# Patient Record
Sex: Male | Born: 1940 | Race: White | Hispanic: No | Marital: Married | Smoking: Current every day smoker
Health system: Southern US, Community
[De-identification: ages and names within clinical notes are randomized; demographics above are authoritative.]

## PROBLEM LIST (undated history)

## (undated) DIAGNOSIS — I10 Essential (primary) hypertension: Secondary | ICD-10-CM

## (undated) DIAGNOSIS — J45909 Unspecified asthma, uncomplicated: Secondary | ICD-10-CM

## (undated) DIAGNOSIS — J449 Chronic obstructive pulmonary disease, unspecified: Secondary | ICD-10-CM

## (undated) DIAGNOSIS — C801 Malignant (primary) neoplasm, unspecified: Secondary | ICD-10-CM

## (undated) DIAGNOSIS — C189 Malignant neoplasm of colon, unspecified: Secondary | ICD-10-CM

## (undated) DIAGNOSIS — M1990 Unspecified osteoarthritis, unspecified site: Secondary | ICD-10-CM

## (undated) HISTORY — PX: ABDOMINAL SURGERY: SHX537

---

## 2006-12-25 ENCOUNTER — Ambulatory Visit: Admission: RE | Admit: 2006-12-25 | Discharge: 2007-01-02 | Payer: Self-pay | Admitting: Radiation Oncology

## 2007-02-26 ENCOUNTER — Ambulatory Visit: Admission: RE | Admit: 2007-02-26 | Discharge: 2007-04-29 | Payer: Self-pay | Admitting: Radiation Oncology

## 2007-03-27 ENCOUNTER — Encounter: Admission: RE | Admit: 2007-03-27 | Discharge: 2007-03-27 | Payer: Self-pay | Admitting: Urology

## 2007-03-28 ENCOUNTER — Ambulatory Visit (HOSPITAL_BASED_OUTPATIENT_CLINIC_OR_DEPARTMENT_OTHER): Admission: RE | Admit: 2007-03-28 | Discharge: 2007-03-28 | Payer: Self-pay | Admitting: Urology

## 2010-10-12 NOTE — Op Note (Signed)
NAME:  Sean Salas, Sean Salas NO.:  [id number]   MEDICAL RECORD NO.:  [id number]          PATIENT TYPE:  REC   LOCATION:  [facility]NC                         FACILITY:  [facility]   PHYSICIAN:  Courtney Paris, M.D.DATE OF BIRTH:  Oct 24, 1940   DATE OF PROCEDURE:  03/28/2007  DATE OF DISCHARGE:                               OPERATIVE REPORT   PREOPERATIVE DIAGNOSIS:  T2B Gleason 4+3 adenocarcinoma of prostate.   POSTOPERATIVE DIAGNOSIS:  T2B Gleason 4+3 adenocarcinoma of prostate.   OPERATION:  Brachytherapy of the prostate plus cysto.   ANESTHESIA:  General.   SURGEON:  Courtney Paris, M.D. and Maryln Gottron, M.D.   BRIEF HISTORY:  The patient is a 70 year old patient who was found to  have clinical T2B Gleason 4+3 adenocarcinoma of the prostate who enters  now for seed implant.  He had hormone deprivation therapy since June  2008 and has shrunk his prostate from a 60 to 27 grams.  PSA had risen  from 2.4 in 2006 to 4.9 in April 2008.  Most of his cancer was at the  right base.  He does enter now for definitive therapy.   The patient was placed in the operating table in dorsal lithotomy  position.  After satisfactory induction of general anesthesia he was  prepped and draped with Betadine in the usual sterile fashion.  Rectal  tube and a Foley catheter were inserted and the planning was done with  ultrasounds by Dr. Dayton Scrape.  When this was done, the implant was started  and a total of 23 needles were used, 58 seeds x 125.  The total apparent  activity was 24.94 mCi.  The catheter was then removed and cystoscope  was then passed after reprepping the patient. Anterior urethra was  normal.  Prostatic urethra was slightly enlarged but no seeds were seen  and the bladder was entered.  There were no seeds within the bladder  even on retrograde view of the bladder neck.  The scope was then removed  and a #16 Foley catheter reinserted and left to straight drainage.  The  patient taken to recovery room in good condition to be later discharged  as an outpatient with detailed written instructions.  He will come back  in two weeks for follow-up.      Courtney Paris, M.D.  Electronically Signed     HMK/MEDQ  D:  03/28/2007  T:  03/28/2007  Job:  [id number]

## 2011-03-09 LAB — COMPREHENSIVE METABOLIC PANEL
ALT: 39
AST: 27
Albumin: 3.9
Creatinine, Ser: 0.79
GFR calc Af Amer: >60
GFR calc non Af Amer: >60
Potassium: 4.3

## 2011-03-09 LAB — CBC
HCT: 37.9 — ABNORMAL LOW
Hemoglobin: 13
MCHC: 34.4
MCV: 90.6
Platelets: 282
RBC: 4.18 — ABNORMAL LOW
RDW: 13.4
WBC: 9.1

## 2011-03-09 LAB — PROTIME-INR: Prothrombin Time: 12.9

## 2014-11-26 DIAGNOSIS — Z85038 Personal history of other malignant neoplasm of large intestine: Secondary | ICD-10-CM | POA: Diagnosis not present

## 2014-11-26 DIAGNOSIS — R351 Nocturia: Secondary | ICD-10-CM | POA: Diagnosis not present

## 2014-11-26 DIAGNOSIS — C61 Malignant neoplasm of prostate: Secondary | ICD-10-CM | POA: Diagnosis not present

## 2014-11-27 DIAGNOSIS — I10 Essential (primary) hypertension: Secondary | ICD-10-CM | POA: Diagnosis not present

## 2014-11-27 DIAGNOSIS — E782 Mixed hyperlipidemia: Secondary | ICD-10-CM | POA: Diagnosis not present

## 2014-11-27 DIAGNOSIS — Z8546 Personal history of malignant neoplasm of prostate: Secondary | ICD-10-CM | POA: Diagnosis not present

## 2014-11-27 DIAGNOSIS — Z85038 Personal history of other malignant neoplasm of large intestine: Secondary | ICD-10-CM | POA: Diagnosis not present

## 2014-11-27 DIAGNOSIS — C801 Malignant (primary) neoplasm, unspecified: Secondary | ICD-10-CM | POA: Diagnosis not present

## 2014-11-27 DIAGNOSIS — G629 Polyneuropathy, unspecified: Secondary | ICD-10-CM | POA: Diagnosis not present

## 2014-11-27 DIAGNOSIS — F17210 Nicotine dependence, cigarettes, uncomplicated: Secondary | ICD-10-CM | POA: Diagnosis not present

## 2014-11-27 DIAGNOSIS — F17200 Nicotine dependence, unspecified, uncomplicated: Secondary | ICD-10-CM | POA: Diagnosis not present

## 2014-11-27 DIAGNOSIS — R7309 Other abnormal glucose: Secondary | ICD-10-CM | POA: Diagnosis not present

## 2014-12-09 DIAGNOSIS — F17200 Nicotine dependence, unspecified, uncomplicated: Secondary | ICD-10-CM | POA: Diagnosis not present

## 2014-12-09 DIAGNOSIS — Z139 Encounter for screening, unspecified: Secondary | ICD-10-CM | POA: Diagnosis not present

## 2014-12-09 DIAGNOSIS — Z0000 Encounter for general adult medical examination without abnormal findings: Secondary | ICD-10-CM | POA: Diagnosis not present

## 2014-12-09 DIAGNOSIS — Z1389 Encounter for screening for other disorder: Secondary | ICD-10-CM | POA: Diagnosis not present

## 2015-03-24 DIAGNOSIS — I10 Essential (primary) hypertension: Secondary | ICD-10-CM | POA: Diagnosis not present

## 2015-03-24 DIAGNOSIS — R7301 Impaired fasting glucose: Secondary | ICD-10-CM | POA: Diagnosis not present

## 2015-03-24 DIAGNOSIS — E782 Mixed hyperlipidemia: Secondary | ICD-10-CM | POA: Diagnosis not present

## 2015-03-31 DIAGNOSIS — Z23 Encounter for immunization: Secondary | ICD-10-CM | POA: Diagnosis not present

## 2015-03-31 DIAGNOSIS — I10 Essential (primary) hypertension: Secondary | ICD-10-CM | POA: Diagnosis not present

## 2015-03-31 DIAGNOSIS — E782 Mixed hyperlipidemia: Secondary | ICD-10-CM | POA: Diagnosis not present

## 2015-03-31 DIAGNOSIS — R7301 Impaired fasting glucose: Secondary | ICD-10-CM | POA: Diagnosis not present

## 2015-05-28 DIAGNOSIS — C61 Malignant neoplasm of prostate: Secondary | ICD-10-CM | POA: Diagnosis not present

## 2015-05-28 DIAGNOSIS — R351 Nocturia: Secondary | ICD-10-CM | POA: Diagnosis not present

## 2015-05-31 ENCOUNTER — Encounter (HOSPITAL_COMMUNITY): Payer: Self-pay | Admitting: *Deleted

## 2015-05-31 ENCOUNTER — Emergency Department (HOSPITAL_COMMUNITY): Payer: Medicare Other | Admitting: Anesthesiology

## 2015-05-31 ENCOUNTER — Emergency Department (HOSPITAL_COMMUNITY): Payer: Medicare Other

## 2015-05-31 ENCOUNTER — Encounter (HOSPITAL_COMMUNITY)
Admission: EM | Disposition: A | Discharge status: Home / Self Care | Payer: Self-pay | Source: Home / Self Care | Attending: Vascular Surgery

## 2015-05-31 ENCOUNTER — Inpatient Hospital Stay (HOSPITAL_COMMUNITY)
Admission: EM | Admit: 2015-05-31 | Discharge: 2015-06-02 | DRG: 271 | Disposition: A | Discharge status: Home / Self Care | Payer: Medicare Other | Attending: Vascular Surgery | Admitting: Vascular Surgery

## 2015-05-31 DIAGNOSIS — I998 Other disorder of circulatory system: Secondary | ICD-10-CM | POA: Diagnosis not present

## 2015-05-31 DIAGNOSIS — T782XXA Anaphylactic shock, unspecified, initial encounter: Secondary | ICD-10-CM | POA: Diagnosis present

## 2015-05-31 DIAGNOSIS — M19031 Primary osteoarthritis, right wrist: Secondary | ICD-10-CM | POA: Diagnosis not present

## 2015-05-31 DIAGNOSIS — M7989 Other specified soft tissue disorders: Secondary | ICD-10-CM | POA: Diagnosis not present

## 2015-05-31 DIAGNOSIS — Z419 Encounter for procedure for purposes other than remedying health state, unspecified: Secondary | ICD-10-CM

## 2015-05-31 DIAGNOSIS — T7840XA Allergy, unspecified, initial encounter: Secondary | ICD-10-CM | POA: Diagnosis not present

## 2015-05-31 DIAGNOSIS — Z789 Other specified health status: Secondary | ICD-10-CM

## 2015-05-31 DIAGNOSIS — I700 Atherosclerosis of aorta: Secondary | ICD-10-CM | POA: Diagnosis not present

## 2015-05-31 DIAGNOSIS — I749 Embolism and thrombosis of unspecified artery: Secondary | ICD-10-CM

## 2015-05-31 DIAGNOSIS — I70229 Atherosclerosis of native arteries of extremities with rest pain, unspecified extremity: Secondary | ICD-10-CM

## 2015-05-31 DIAGNOSIS — E872 Acidosis, unspecified: Secondary | ICD-10-CM

## 2015-05-31 DIAGNOSIS — F17210 Nicotine dependence, cigarettes, uncomplicated: Secondary | ICD-10-CM | POA: Diagnosis present

## 2015-05-31 DIAGNOSIS — I2699 Other pulmonary embolism without acute cor pulmonale: Secondary | ICD-10-CM | POA: Diagnosis not present

## 2015-05-31 DIAGNOSIS — J9811 Atelectasis: Secondary | ICD-10-CM | POA: Diagnosis not present

## 2015-05-31 DIAGNOSIS — Z79899 Other long term (current) drug therapy: Secondary | ICD-10-CM | POA: Diagnosis not present

## 2015-05-31 DIAGNOSIS — R0602 Shortness of breath: Secondary | ICD-10-CM | POA: Diagnosis not present

## 2015-05-31 DIAGNOSIS — I10 Essential (primary) hypertension: Secondary | ICD-10-CM | POA: Diagnosis not present

## 2015-05-31 DIAGNOSIS — I745 Embolism and thrombosis of iliac artery: Principal | ICD-10-CM | POA: Diagnosis present

## 2015-05-31 DIAGNOSIS — Z85038 Personal history of other malignant neoplasm of large intestine: Secondary | ICD-10-CM | POA: Diagnosis not present

## 2015-05-31 DIAGNOSIS — J449 Chronic obstructive pulmonary disease, unspecified: Secondary | ICD-10-CM | POA: Diagnosis present

## 2015-05-31 DIAGNOSIS — R6889 Other general symptoms and signs: Secondary | ICD-10-CM | POA: Diagnosis not present

## 2015-05-31 DIAGNOSIS — J45909 Unspecified asthma, uncomplicated: Secondary | ICD-10-CM | POA: Diagnosis not present

## 2015-05-31 DIAGNOSIS — J90 Pleural effusion, not elsewhere classified: Secondary | ICD-10-CM | POA: Diagnosis not present

## 2015-05-31 DIAGNOSIS — M1990 Unspecified osteoarthritis, unspecified site: Secondary | ICD-10-CM | POA: Diagnosis not present

## 2015-05-31 DIAGNOSIS — J439 Emphysema, unspecified: Secondary | ICD-10-CM | POA: Diagnosis not present

## 2015-05-31 HISTORY — DX: Unspecified osteoarthritis, unspecified site: M19.90

## 2015-05-31 HISTORY — DX: Chronic obstructive pulmonary disease, unspecified: J44.9

## 2015-05-31 HISTORY — DX: Malignant neoplasm of colon, unspecified: C18.9

## 2015-05-31 HISTORY — DX: Malignant (primary) neoplasm, unspecified: C80.1

## 2015-05-31 HISTORY — DX: Unspecified asthma, uncomplicated: J45.909

## 2015-05-31 HISTORY — PX: THROMBECTOMY FEMORAL ARTERY: SHX6406

## 2015-05-31 HISTORY — DX: Essential (primary) hypertension: I10

## 2015-05-31 LAB — CBC WITH DIFFERENTIAL/PLATELET
Basophils Absolute: 0 10*3/uL (ref 0.0–0.1)
Basophils Relative: 0 %
EOS ABS: 0 10*3/uL (ref 0.0–0.7)
EOS PCT: 0 %
HCT: 37.6 % — ABNORMAL LOW (ref 39.0–52.0)
Hemoglobin: 13 g/dL (ref 13.0–17.0)
LYMPHS ABS: 0.3 10*3/uL — AB (ref 0.7–4.0)
Lymphocytes Relative: 2 %
MCH: 31.6 pg (ref 26.0–34.0)
MCHC: 34.6 g/dL (ref 30.0–36.0)
MCV: 91.5 fL (ref 78.0–100.0)
MONO ABS: 0.8 10*3/uL (ref 0.1–1.0)
Monocytes Relative: 4 %
Neutro Abs: 20.1 10*3/uL — ABNORMAL HIGH (ref 1.7–7.7)
Neutrophils Relative %: 94 %
PLATELETS: 152 10*3/uL (ref 150–400)
RBC: 4.11 MIL/uL — AB (ref 4.22–5.81)
RDW: 13.1 % (ref 11.5–15.5)
WBC: 21.3 10*3/uL — AB (ref 4.0–10.5)

## 2015-05-31 LAB — I-STAT CHEM 8, ED
BUN: 14 mg/dL (ref 6–20)
CALCIUM ION: 1.06 mmol/L — AB (ref 1.13–1.30)
CHLORIDE: 105 mmol/L (ref 101–111)
Creatinine, Ser: 1 mg/dL (ref 0.61–1.24)
GLUCOSE: 214 mg/dL — AB (ref 65–99)
HCT: 40 % (ref 39.0–52.0)
HEMOGLOBIN: 13.6 g/dL (ref 13.0–17.0)
Potassium: 4 mmol/L (ref 3.5–5.1)
SODIUM: 141 mmol/L (ref 135–145)
TCO2: 21 mmol/L (ref 0–100)

## 2015-05-31 LAB — URINALYSIS, ROUTINE W REFLEX MICROSCOPIC
BILIRUBIN URINE: NEGATIVE
GLUCOSE, UA: 100 mg/dL — AB
HGB URINE DIPSTICK: NEGATIVE
KETONES UR: 15 mg/dL — AB
Leukocytes, UA: NEGATIVE
Nitrite: NEGATIVE
PH: 5 (ref 5.0–8.0)
PROTEIN: 30 mg/dL — AB
Specific Gravity, Urine: >1.046 — ABNORMAL HIGH (ref 1.005–1.030)

## 2015-05-31 LAB — URINE MICROSCOPIC-ADD ON

## 2015-05-31 LAB — I-STAT CG4 LACTIC ACID, ED: LACTIC ACID, VENOUS: 3.56 mmol/L — AB (ref 0.5–2.0)

## 2015-05-31 SURGERY — THROMBECTOMY, ARTERY, FEMORAL
Anesthesia: General | Site: Leg Upper | Laterality: Right

## 2015-05-31 MED ORDER — FENTANYL CITRATE (PF) 250 MCG/5ML IJ SOLN
INTRAMUSCULAR | Status: DC | PRN
  Administered 2015-05-31 (×2): 100 ug via INTRAVENOUS
  Administered 2015-05-31 (×2): 50 ug via INTRAVENOUS

## 2015-05-31 MED ORDER — HEPARIN (PORCINE) IN NACL 100-0.45 UNIT/ML-% IJ SOLN
1300.0000 [IU]/h | INTRAMUSCULAR | Status: DC
  Filled 2015-05-31: qty 250

## 2015-05-31 MED ORDER — HYDROCORTISONE NA SUCCINATE PF 1000 MG IJ SOLR
INTRAMUSCULAR | Status: DC | PRN
  Administered 2015-05-31: 125 mg via INTRAVENOUS

## 2015-05-31 MED ORDER — HYDROMORPHONE HCL 1 MG/ML IJ SOLN: 0.2500 mg | INTRAMUSCULAR | Status: DC | PRN

## 2015-05-31 MED ORDER — FENTANYL CITRATE (PF) 250 MCG/5ML IJ SOLN
INTRAMUSCULAR | Status: AC
  Filled 2015-05-31: qty 5

## 2015-05-31 MED ORDER — PHENOL 1.4 % MT LIQD: 1.0000 | OROMUCOSAL | Status: DC | PRN

## 2015-05-31 MED ORDER — LACTATED RINGERS IV SOLN
INTRAVENOUS | Status: DC | PRN
  Administered 2015-05-31: 20:00:00 via INTRAVENOUS

## 2015-05-31 MED ORDER — IOHEXOL 350 MG/ML SOLN
100.0000 mL | Freq: Once | INTRAVENOUS | Status: AC | PRN
  Administered 2015-05-31: 100 mL via INTRAVENOUS

## 2015-05-31 MED ORDER — 0.9 % SODIUM CHLORIDE (POUR BTL) OPTIME
TOPICAL | Status: DC | PRN
  Administered 2015-05-31: 2000 mL

## 2015-05-31 MED ORDER — PROPOFOL 10 MG/ML IV BOLUS
INTRAVENOUS | Status: AC
  Filled 2015-05-31: qty 20

## 2015-05-31 MED ORDER — FAMOTIDINE IN NACL 20-0.9 MG/50ML-% IV SOLN
20.0000 mg | Freq: Two times a day (BID) | INTRAVENOUS | Status: DC
  Administered 2015-06-01: 20 mg via INTRAVENOUS
  Filled 2015-05-31: qty 50

## 2015-05-31 MED ORDER — ONDANSETRON HCL 4 MG/2ML IJ SOLN
INTRAMUSCULAR | Status: DC | PRN
Start: 2015-05-31 — End: 2015-05-31
  Administered 2015-05-31: 4 mg via INTRAVENOUS

## 2015-05-31 MED ORDER — MICROFIBRILLAR COLL HEMOSTAT EX PADS
MEDICATED_PAD | CUTANEOUS | Status: DC | PRN
  Administered 2015-05-31: 1 via TOPICAL

## 2015-05-31 MED ORDER — PROMETHAZINE HCL 25 MG/ML IJ SOLN: 6.2500 mg | INTRAMUSCULAR | Status: DC | PRN

## 2015-05-31 MED ORDER — SODIUM CHLORIDE 0.9 % IV SOLN
INTRAVENOUS | Status: DC | PRN
  Administered 2015-05-31: 500 mL

## 2015-05-31 MED ORDER — POTASSIUM CHLORIDE CRYS ER 20 MEQ PO TBCR
20.0000 meq | EXTENDED_RELEASE_TABLET | Freq: Every day | ORAL | Status: DC | PRN
Start: 2015-05-31 — End: 2015-06-02

## 2015-05-31 MED ORDER — PROPOFOL 10 MG/ML IV BOLUS
INTRAVENOUS | Status: DC | PRN
  Administered 2015-05-31: 40 mg via INTRAVENOUS
  Administered 2015-05-31: 100 mg via INTRAVENOUS

## 2015-05-31 MED ORDER — LOSARTAN POTASSIUM 25 MG PO TABS
25.0000 mg | ORAL_TABLET | Freq: Every day | ORAL | Status: DC
  Administered 2015-06-01 – 2015-06-02 (×2): 25 mg via ORAL
  Filled 2015-05-31 (×2): qty 1

## 2015-05-31 MED ORDER — BISACODYL 10 MG RE SUPP: 10.0000 mg | Freq: Every day | RECTAL | Status: DC | PRN

## 2015-05-31 MED ORDER — DEXTROSE 5 % IV SOLN
1.5000 g | Freq: Two times a day (BID) | INTRAVENOUS | Status: AC
  Administered 2015-06-01 (×2): 1.5 g via INTRAVENOUS
  Filled 2015-05-31 (×2): qty 1.5

## 2015-05-31 MED ORDER — CEFAZOLIN SODIUM-DEXTROSE 2-3 GM-% IV SOLR
INTRAVENOUS | Status: DC | PRN
  Administered 2015-05-31: 2 g via INTRAVENOUS

## 2015-05-31 MED ORDER — ACETAMINOPHEN 325 MG PO TABS
325.0000 mg | ORAL_TABLET | ORAL | Status: DC | PRN
  Administered 2015-06-02: 650 mg via ORAL
  Filled 2015-05-31: qty 2

## 2015-05-31 MED ORDER — MORPHINE SULFATE (PF) 2 MG/ML IV SOLN: 2.0000 mg | INTRAVENOUS | Status: DC | PRN

## 2015-05-31 MED ORDER — ONDANSETRON HCL 4 MG/2ML IJ SOLN: 4.0000 mg | Freq: Four times a day (QID) | INTRAMUSCULAR | Status: DC | PRN

## 2015-05-31 MED ORDER — SUCCINYLCHOLINE CHLORIDE 20 MG/ML IJ SOLN
INTRAMUSCULAR | Status: DC | PRN
  Administered 2015-05-31: 100 mg via INTRAVENOUS

## 2015-05-31 MED ORDER — OXYCODONE-ACETAMINOPHEN 5-325 MG PO TABS
1.0000 | ORAL_TABLET | ORAL | Status: DC | PRN
  Administered 2015-06-01: 1 via ORAL
  Filled 2015-05-31: qty 1

## 2015-05-31 MED ORDER — POLYETHYLENE GLYCOL 3350 17 G PO PACK: 17.0000 g | PACK | Freq: Every day | ORAL | Status: DC | PRN

## 2015-05-31 MED ORDER — PHENYLEPHRINE HCL 10 MG/ML IJ SOLN
INTRAMUSCULAR | Status: DC | PRN
  Administered 2015-05-31 (×2): 80 ug via INTRAVENOUS

## 2015-05-31 MED ORDER — DIPHENHYDRAMINE HCL 50 MG/ML IJ SOLN
INTRAMUSCULAR | Status: DC | PRN
  Administered 2015-05-31: 12.5 mg via INTRAVENOUS

## 2015-05-31 MED ORDER — HYDRALAZINE HCL 20 MG/ML IJ SOLN: 5.0000 mg | INTRAMUSCULAR | Status: DC | PRN

## 2015-05-31 MED ORDER — FLEET ENEMA 7-19 GM/118ML RE ENEM
1.0000 | ENEMA | Freq: Once | RECTAL | Status: DC | PRN
  Filled 2015-05-31: qty 1

## 2015-05-31 MED ORDER — DOCUSATE SODIUM 100 MG PO CAPS
100.0000 mg | ORAL_CAPSULE | Freq: Every day | ORAL | Status: DC
  Administered 2015-06-01 – 2015-06-02 (×2): 100 mg via ORAL
  Filled 2015-05-31 (×2): qty 1

## 2015-05-31 MED ORDER — ACETAMINOPHEN 325 MG RE SUPP
325.0000 mg | RECTAL | Status: DC | PRN
  Filled 2015-05-31: qty 2

## 2015-05-31 MED ORDER — HEPARIN SODIUM (PORCINE) 1000 UNIT/ML IJ SOLN
INTRAMUSCULAR | Status: DC | PRN
  Administered 2015-05-31: 8000 [IU] via INTRAVENOUS

## 2015-05-31 MED ORDER — GUAIFENESIN-DM 100-10 MG/5ML PO SYRP: 15.0000 mL | ORAL_SOLUTION | ORAL | Status: DC | PRN

## 2015-05-31 MED ORDER — METOPROLOL TARTRATE 1 MG/ML IV SOLN: 2.0000 mg | INTRAVENOUS | Status: DC | PRN

## 2015-05-31 MED ORDER — THROMBIN 20000 UNITS EX SOLR
CUTANEOUS | Status: AC
  Filled 2015-05-31: qty 20000

## 2015-05-31 MED ORDER — DIPHENHYDRAMINE HCL 50 MG/ML IJ SOLN
25.0000 mg | Freq: Four times a day (QID) | INTRAMUSCULAR | Status: DC
  Administered 2015-06-01 (×2): 25 mg via INTRAVENOUS
  Filled 2015-05-31 (×2): qty 1

## 2015-05-31 MED ORDER — MIDAZOLAM HCL 2 MG/2ML IJ SOLN
INTRAMUSCULAR | Status: AC
  Filled 2015-05-31: qty 2

## 2015-05-31 MED ORDER — HEPARIN (PORCINE) IN NACL 100-0.45 UNIT/ML-% IJ SOLN
1300.0000 [IU]/h | INTRAMUSCULAR | Status: DC
  Administered 2015-05-31: 1300 [IU]/h via INTRAVENOUS
  Filled 2015-05-31: qty 250

## 2015-05-31 MED ORDER — LABETALOL HCL 5 MG/ML IV SOLN: 10.0000 mg | INTRAVENOUS | Status: DC | PRN

## 2015-05-31 MED ORDER — MIDAZOLAM HCL 5 MG/5ML IJ SOLN
INTRAMUSCULAR | Status: DC | PRN
  Administered 2015-05-31 (×2): 1 mg via INTRAVENOUS

## 2015-05-31 MED ORDER — SODIUM CHLORIDE 0.9 % IV SOLN
INTRAVENOUS | Status: DC
  Administered 2015-06-01: 05:00:00 via INTRAVENOUS

## 2015-05-31 MED ORDER — SODIUM CHLORIDE 0.9 % IV SOLN: 500.0000 mL | Freq: Once | INTRAVENOUS | Status: DC | PRN

## 2015-05-31 SURGICAL SUPPLY — 59 items
BANDAGE ELASTIC 4 VELCRO ST LF (GAUZE/BANDAGES/DRESSINGS) IMPLANT
BANDAGE ESMARK 6X9 LF (GAUZE/BANDAGES/DRESSINGS) IMPLANT
BNDG ESMARK 6X9 LF (GAUZE/BANDAGES/DRESSINGS)
CANISTER SUCTION 2500CC (MISCELLANEOUS) ×3 IMPLANT
CLIP TI MEDIUM 24 (CLIP) ×3 IMPLANT
CLIP TI WIDE RED SMALL 24 (CLIP) ×3 IMPLANT
COVER PROBE W GEL 5X96 (DRAPES) ×3 IMPLANT
CUFF TOURNIQUET SINGLE 24IN (TOURNIQUET CUFF) IMPLANT
CUFF TOURNIQUET SINGLE 34IN LL (TOURNIQUET CUFF) IMPLANT
CUFF TOURNIQUET SINGLE 44IN (TOURNIQUET CUFF) IMPLANT
DERMABOND ADVANCED (GAUZE/BANDAGES/DRESSINGS) ×2
DERMABOND ADVANCED .7 DNX12 (GAUZE/BANDAGES/DRESSINGS) ×1 IMPLANT
DRAIN CHANNEL 15F RND FF W/TCR (WOUND CARE) IMPLANT
DRAPE C-ARM 42X72 X-RAY (DRAPES) IMPLANT
DRSG COVADERM 4X10 (GAUZE/BANDAGES/DRESSINGS) IMPLANT
DRSG COVADERM 4X8 (GAUZE/BANDAGES/DRESSINGS) IMPLANT
ELECT REM PT RETURN 9FT ADLT (ELECTROSURGICAL) ×3
ELECTRODE REM PT RTRN 9FT ADLT (ELECTROSURGICAL) ×1 IMPLANT
EVACUATOR SILICONE 100CC (DRAIN) IMPLANT
GLOVE BIO SURGEON STRL SZ 6.5 (GLOVE) ×2 IMPLANT
GLOVE BIO SURGEON STRL SZ7 (GLOVE) ×3 IMPLANT
GLOVE BIO SURGEONS STRL SZ 6.5 (GLOVE) ×1
GLOVE BIOGEL PI IND STRL 6.5 (GLOVE) ×1 IMPLANT
GLOVE BIOGEL PI IND STRL 7.5 (GLOVE) ×2 IMPLANT
GLOVE BIOGEL PI INDICATOR 6.5 (GLOVE) ×2
GLOVE BIOGEL PI INDICATOR 7.5 (GLOVE) ×4
GLOVE SURG SS PI 7.5 STRL IVOR (GLOVE) ×6 IMPLANT
GOWN STRL REUS W/ TWL LRG LVL3 (GOWN DISPOSABLE) ×3 IMPLANT
GOWN STRL REUS W/TWL LRG LVL3 (GOWN DISPOSABLE) ×6
HEMOSTAT SPONGE AVITENE ULTRA (HEMOSTASIS) ×3 IMPLANT
INSERT FOGARTY SM (MISCELLANEOUS) IMPLANT
KIT BASIN OR (CUSTOM PROCEDURE TRAY) ×3 IMPLANT
KIT ROOM TURNOVER OR (KITS) ×3 IMPLANT
MARKER GRAFT CORONARY BYPASS (MISCELLANEOUS) IMPLANT
NS IRRIG 1000ML POUR BTL (IV SOLUTION) ×6 IMPLANT
PACK PERIPHERAL VASCULAR (CUSTOM PROCEDURE TRAY) ×3 IMPLANT
PAD ARMBOARD 7.5X6 YLW CONV (MISCELLANEOUS) ×6 IMPLANT
PADDING CAST COTTON 6X4 STRL (CAST SUPPLIES) IMPLANT
SET MICROPUNCTURE 5F STIFF (MISCELLANEOUS) IMPLANT
STAPLER VISISTAT 35W (STAPLE) IMPLANT
STOPCOCK 4 WAY LG BORE MALE ST (IV SETS) IMPLANT
SUT ETHILON 3 0 PS 1 (SUTURE) IMPLANT
SUT GORETEX 5 0 TT13 24 (SUTURE) IMPLANT
SUT GORETEX 6.0 TT13 (SUTURE) IMPLANT
SUT MNCRL AB 4-0 PS2 18 (SUTURE) ×3 IMPLANT
SUT PROLENE 5 0 C 1 24 (SUTURE) IMPLANT
SUT PROLENE 6 0 BV (SUTURE) ×6 IMPLANT
SUT PROLENE 7 0 BV 1 (SUTURE) IMPLANT
SUT SILK 2 0 FS (SUTURE) IMPLANT
SUT SILK 3 0 (SUTURE)
SUT SILK 3-0 18XBRD TIE 12 (SUTURE) IMPLANT
SUT VIC AB 2-0 CT1 27 (SUTURE) ×2
SUT VIC AB 2-0 CT1 TAPERPNT 27 (SUTURE) ×1 IMPLANT
SUT VIC AB 3-0 SH 27 (SUTURE) ×2
SUT VIC AB 3-0 SH 27X BRD (SUTURE) ×1 IMPLANT
TRAY FOLEY W/METER SILVER 16FR (SET/KITS/TRAYS/PACK) IMPLANT
TUBING EXTENTION W/L.L. (IV SETS) IMPLANT
UNDERPAD 30X30 INCONTINENT (UNDERPADS AND DIAPERS) ×3 IMPLANT
WATER STERILE IRR 1000ML POUR (IV SOLUTION) ×3 IMPLANT

## 2015-05-31 NOTE — Op Note (Signed)
OPERATIVE NOTE   PROCEDURE: 1. Right iliac thromboembolectomy  PRE-OPERATIVE DIAGNOSIS: right external iliac artery thromboembolism  POST-OPERATIVE DIAGNOSIS: same  SURGEON: Adele Barthel, MD  ASSISTANT(S): Leontine Locket, PAC   ANESTHESIA: general  ESTIMATED BLOOD LOSS: 300 cc  FINDING(S): 1.  Organized thrombus obtained from iliac artery 2.  Posterior plaquing in common femoral artery  3.  Palpable femoral pulse at end of case 4.  Dopplerable right posterior tibial artery    SPECIMEN(S):  Right iliac artery thrombus  INDICATIONS:   Sean Salas is a 75 y.o. male who presents with acute onset of right leg pain and CTA consistent with acute thromboembolism in right external iliac artery.  I recommended: Right leg thromboembolectomy.  The risk, benefits, and alternative for bypass operations were discussed with the patient.  The patient is aware the risks include but are not limited to: bleeding, infection, myocardial infarction, stroke, limb loss, nerve damage, need for additional procedures in the future, wound complications, and inability to complete the thrombectomy.  The patient is aware of these risks and agreed to proceed.   DESCRIPTION: After obtaining full informed written consent, the patient was brought back to the operating room and placed supine upon the operating table.  The patient received IV antibiotics prior to induction.  After obtaining adequate anesthesia, the patient was prepped and draped in the standard fashion for: right leg exploration.  I marked the location of the right common femoral artery with Sonosite guidance.  I made an incision over the common femoral artery and then dissected out the common femoral artery from the inguinal ligament down to the femoral bifurcation.  I was initially going to patch the artery but there appeared to be 5-6 mm of lumen and a soft area proximally.  I placed vessel loops around all branches of the femoral artery and the  proximal common femoral artery.  I gave 8000 unit of Heparin intravenously, which was a therapeutic bolus.  I placed all vessel loops under tension and clamped the distal external iliac artery.  I made a transverse arteriotomy.  I sequentially released all vessel loops and clamps to check the bleeding in all vessels.  There was limited retrograde bleeding but persistent antegrade bleeding from the distal external iliac artery despite the embolism.  I passed a 4 Fogarty proximally and extracted a discrete organized clot.  This resulted in return of pulsatile bleeding.  After two more negative passes, I felt this was an adequate response to thrombectomy.  I tried to pass a 4 Fogarty and 3 Fogarty distally but the distal common femoral artery plaque and angle prevented easy passage.  To avoid injury to the artery and branches, I abort this attempt and backbled both the profunda femoral artery and superficial femoral artery.  There was no clot from either artery.  The backbleeding was limited, as expected given the lack of collateralization expected in an acute embolism.  The CTA one hour prior to procedure demonstrated no other embolism other than the iliac artery, so I felt comfortable proceeding as planned.  I repaired the arteriotomy with two running stitches of 6-0 Prolene, one from each end.  Prior to completion, I backbleed the distal branches.  There was no clot.  I completed this repair by tying the two sutures together.  I released all vessel loops and clamps.  Immediately, there was resumption of a pulse in the right groin.  I placed Avitene in the groin and held pressure for a few  minutes.  After waiting a few minutes, there was no further bleeding.  I repaired the right groin with a double layer of 2-0 Vicryl, a double layer of 3-0 Vicryl, and a running subcuticular stitch of 4-0 Monocryl.  The skin was cleaned, dried, and reinforced with Dermabond.  Distally there was posterior tibial artery signal.      COMPLICATIONS: none  CONDITION: stable  Adele Barthel, MD [facility] Office: [phone] Pager: [phone]  05/31/2015, 9:36 PM

## 2015-05-31 NOTE — Anesthesia Preprocedure Evaluation (Addendum)
Anesthesia Evaluation  Patient identified by MRN, date of birth, ID band Patient awake    Reviewed: NPO status , Patient's Chart, lab work & pertinent test results  Airway Mallampati: II  TM Distance: >3 FB   Mouth opening: Limited Mouth Opening  Dental  (+) Missing, Poor Dentition, Loose, Dental Advisory Given   Pulmonary asthma , pneumonia, COPD, Current Smoker,    breath sounds clear to auscultation       Cardiovascular hypertension, + Peripheral Vascular Disease   Rhythm:Regular Rate:Normal     Neuro/Psych    GI/Hepatic   Endo/Other    Renal/GU      Musculoskeletal  (+) Arthritis ,   Abdominal   Peds  Hematology   Anesthesia Other Findings   Reproductive/Obstetrics                           Anesthesia Physical Anesthesia Plan  ASA: III and emergent  Anesthesia Plan: General   Post-op Pain Management:    Induction: Intravenous  Airway Management Planned: Oral ETT  Additional Equipment:   Intra-op Plan:   Post-operative Plan: Extubation in OR  Informed Consent: I have reviewed the patients History and Physical, chart, labs and discussed the procedure including the risks, benefits and alternatives for the proposed anesthesia with the patient or authorized representative who has indicated his/her understanding and acceptance.   Dental advisory given  Plan Discussed with: CRNA, Surgeon and Anesthesiologist  Anesthesia Plan Comments:        Anesthesia Quick Evaluation

## 2015-05-31 NOTE — Anesthesia Procedure Notes (Signed)
Procedure Name: Intubation Date/Time: 05/31/2015 8:44 PM Performed by: Maude Leriche D Pre-anesthesia Checklist: Patient identified, Emergency Drugs available, Suction available, Patient being monitored and Timeout performed Patient Re-evaluated:Patient Re-evaluated prior to inductionOxygen Delivery Method: Circle system utilized Preoxygenation: Pre-oxygenation with 100% oxygen Intubation Type: IV induction Ventilation: Mask ventilation without difficulty Laryngoscope Size: Miller and 2 Grade View: Grade I Tube type: Oral Tube size: 7.5 mm Number of attempts: 1 Airway Equipment and Method: Stylet Placement Confirmation: ETT inserted through vocal cords under direct vision,  positive ETCO2 and breath sounds checked- equal and bilateral Secured at: 22 cm Tube secured with: Tape Dental Injury: Teeth and Oropharynx as per pre-operative assessment

## 2015-05-31 NOTE — ED Notes (Signed)
Pt returns from ct scan. 

## 2015-05-31 NOTE — Anesthesia Postprocedure Evaluation (Signed)
Anesthesia Post Note  Patient: Sean Salas  Procedure(s) Performed: Procedure(s) (LRB): Embolectomy Right Iliac Artery (Right)  Patient location during evaluation: PACU Anesthesia Type: General Level of consciousness: awake and alert Pain management: pain level controlled Vital Signs Assessment: post-procedure vital signs reviewed and stable Respiratory status: spontaneous breathing, nonlabored ventilation, respiratory function stable and patient connected to nasal cannula oxygen Cardiovascular status: blood pressure returned to baseline and stable Postop Assessment: no signs of nausea or vomiting Anesthetic complications: no    Last Vitals:  Filed Vitals:   05/31/15 2245 05/31/15 2255  BP: 127/63   Pulse: 85 76  Temp:  36.4 C  Resp: 16 13    Last Pain:  Filed Vitals:   05/31/15 2257  PainSc: 3                  Aristea Posada,Demetrice TERRILL

## 2015-05-31 NOTE — ED Provider Notes (Addendum)
CSN: [id number]     Arrival date & time 05/31/15  1723 History   First MD Initiated Contact with Patient 05/31/15 1743     No chief complaint on file.    (Consider location/radiation/quality/duration/timing/severity/associated sxs/prior Treatment) HPI Comments: Pt with extensive smoking hx, COPD comes in with cc of pulseless lower extremity from [facility]. Dr. Bridgett Larsson had requested Er to ER transfer. Pt had gone to the hospital after he developed hives and feeling flushed and having some dib. Pt also had tingling in his mouth and felt like he had swelling of his throat. He was given 0.3 mg epi x 2, solumedrol, pepcid and benadryl. He developed some ankle and leg pain thereafter, and the staff noted that the leg was cooler and discolored, with no pulse -so he was sent here.  Pt has no allergic type complains at this time. He has mild ankle pain. Denies claudication. He has no leg pain currently, or thigh pain.   ROS 10 Systems reviewed and are negative for acute change except as noted in the HPI.     The history is provided by the patient.    Past Medical History  Diagnosis Date  . Hypertension   . Asthma   . COPD (chronic obstructive pulmonary disease) ([place])   . Arthritis   . Cancer ([place])   . Colon cancer [facility])    Past Surgical History  Procedure Laterality Date  . Abdominal surgery     No family history on file. Social History  Substance Use Topics  . Smoking status: Current Every Day Smoker -- 1.00 packs/day    Types: Cigarettes  . Smokeless tobacco: None  . Alcohol Use: No    Review of Systems    Allergies  Magnesium-containing compounds  Home Medications   Prior to Admission medications   Not on File   BP 133/54 mmHg  Pulse 83  Temp(Src) 98.2 F (36.8 C) (Oral)  Resp 15  SpO2 95% Physical Exam  Constitutional: He is oriented to person, place, and time. He appears well-developed.  HENT:  Head: Normocephalic and atraumatic.  Eyes: Conjunctivae  and EOM are normal. Pupils are equal, round, and reactive to light.  Neck: Normal range of motion. Neck supple.  Cardiovascular: Normal rate, regular rhythm and normal heart sounds.   Pt's RLE has a poor pulse. I can faintly feel a femoral pulse. Pt has dopplerable DP when i am palpating the femoral region. Skin is warm, leg is not discolored.   Pulmonary/Chest: Effort normal and breath sounds normal. No respiratory distress. He has no wheezes.  Abdominal: Soft. Bowel sounds are normal. He exhibits no distension. There is no tenderness. There is no rebound and no guarding.  Neurological: He is alert and oriented to person, place, and time.  Skin: Skin is warm. No rash noted.  Nursing note and vitals reviewed.   ED Course  Procedures (including critical care time) Labs Review Labs Reviewed  CBC WITH DIFFERENTIAL/PLATELET - Abnormal; Notable for the following:    WBC 21.3 (*)    RBC 4.11 (*)    HCT 37.6 (*)    Neutro Abs 20.1 (*)    Lymphs Abs 0.3 (*)    All other components within normal limits  I-STAT CHEM 8, ED - Abnormal; Notable for the following:    Glucose, Bld 214 (*)    Calcium, Ion 1.06 (*)    All other components within normal limits  I-STAT CG4 LACTIC ACID, ED - Abnormal; Notable for the  following:    Lactic Acid, Venous 3.56 (*)    All other components within normal limits  URINE CULTURE  HEPARIN LEVEL (UNFRACTIONATED)  HEPARIN LEVEL (UNFRACTIONATED)  CBC  URINALYSIS, ROUTINE W REFLEX MICROSCOPIC (NOT AT [facility])    Imaging Review Ct Angio Ao+bifem W/cm &/or Wo/cm  05/31/2015  CLINICAL DATA:  Right lower extremity pain with nonpalpable pulse. EXAM: CT ANGIOGRAPHY AORTOBIFEMORAL WITHOUT AND WITH CONTRAST CONTRAST:  11mL OMNIPAQUE IOHEXOL 350 MG/ML SOLN COMPARISON:  None. FINDINGS: There is a 4 cm occlusion of the right external iliac artery. I suspect this is an acute thrombus based on the clinical history. There is diffuse atherosclerotic irregularity of the  abdominal aorta. Superior and inferior mesenteric arteries are patent. Celiac artery is patent. Single patent renal arteries. Slight narrowing at the origin of the left renal artery. Common femoral arteries are widely patent. Diffuse atheromatous irregularity of the superficial femoral arteries bilaterally without occlusion. Popliteal arteries are widely patent. There is occlusion of the proximal anterior tibial arteries bilaterally. The peroneal arteries and posterior tibial arteries are patent bilaterally. The heart size and visible pulmonary vascularity are normal. Minimal atelectasis at the lung bases. No pleural effusions. There is a small amount of fluid around the distal esophagus and there is suggestion of mucosal thickening in a posterior to the left atrium. This could represent esophagitis. I cannot exclude a mass. Liver, spleen, pancreas, and adrenal glands are normal. Benign appearing cysts on the upper pole the right kidney, 32 mm and 23 mm. Bowel appears normal. Bladder is normal. Radioactive seeds in the prostate gland. No adenopathy. The osseous structures of the lower extremities are normal. Review of the MIP images confirms the above findings. IMPRESSION: 4 cm occlusion of the right external iliac artery, probably acute. 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 Atherosclerosis of the superficial femoral arteries. 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 Electronically Signed   By: Lorriane Shire M.D.   On: 05/31/2015 19:24   I have personally reviewed and evaluated these images and lab results as part of my medical decision-making.   EKG Interpretation None      MDM   Final diagnoses:  Allergic reaction, initial encounter  Critical lower limb ischemia  Lactic acidosis    Pt transferred for vascular evaluation. He doesn't have a palpable pulse in the DP, and has extensive smoking hx. However, he doesn't have any complains  consistent with arterial insufficiency, and the extremity doesn't look like it has critical limb ischemia, but we suspect that he does have a large clot burden.  Dr. Bridgett Larsson called. Ordered Ct scan per his request.  No allergy like symptoms at this time.   @7 :10: Dr. Bridgett Larsson, Vascular Surgery saw the patient and recommends that pt be admitted to hospital and continue heparin. He would appreciate medical admission.  @7 :30: Dr. Bridgett Larsson will take patient to the OR.   CRITICAL CARE Performed by: Varney Biles   Total critical care time: 35 minutes  Critical care time was exclusive of separately billable procedures and treating other patients.  Critical care was necessary to treat or prevent imminent or life-threatening deterioration.  Critical care was time spent personally by me on the following activities: development of treatment plan with patient and/or surrogate as well as nursing, discussions with consultants, evaluation of patient's response to treatment, examination of patient, obtaining history from patient or surrogate, ordering and performing treatments and interventions, ordering and review of laboratory studies, ordering and review of radiographic  studies, pulse oximetry and re-evaluation of patient's condition.  Varney Biles, MD 05/31/15 Joen Laura  Varney Biles, MD 05/31/15 [id number]

## 2015-05-31 NOTE — Progress Notes (Signed)
ANTICOAGULATION CONSULT NOTE - Follow Up Consult  Pharmacy Consult for heparin Indication: pulseless R leg  Allergies  Allergen Reactions  . Magnesium-Containing Compounds     Magnesium in soft drinks - hives    Patient Measurements:  Patient stated weight: 88 kg Height: 5'10" Heparin Dosing Weight: 78.5 kg  Vital Signs: Temp: 97.5 F (36.4 C) (01/01 2255) Temp Source: Oral (01/01 1735) BP: 127/63 mmHg (01/01 2245) Pulse Rate: 76 (01/01 2255)  Labs:  Recent Labs  05/31/15 1755 05/31/15 1805  HGB 13.0 13.6  HCT 37.6* 40.0  PLT 152  --   CREATININE  --  1.00    CrCl cannot be calculated (Unknown ideal weight.).   Medical History: Past Medical History  Diagnosis Date  . Hypertension   . Asthma   . COPD (chronic obstructive pulmonary disease) ([place])   . Arthritis   . Cancer ([place])   . Colon cancer [facility])     Assessment: 75 yo M presents to ED from [facility] with pulseless R leg. Patient received 5000 unit bolus at 1543 and started on heparin drip at 1000 units/hr prior to arrival. Heparin drip discontinued when patient arrived to [facility]. Pharmacy consulted to restart heparin.   Now s/p right iliac thromboembolectomy. Patient received a heparin bolus of 8000 units intravenously per MD prior to the procedure. Now resuming heparin.   H/H stable, Plt wnl. No s/sx of bleeding noted.  Goal of Therapy:  Heparin level 0.3-0.7 units/ml Monitor platelets by anticoagulation protocol: Yes   Plan:  Resume heparin infusion at 1300 units/hr. NO BOLUS  Check anti-Xa level in 8 hours and daily while on heparin Continue to monitor H&H and platelets  Albertina Parr, PharmD., BCPS Clinical Pharmacist Pager [phone]

## 2015-05-31 NOTE — ED Notes (Signed)
Pt initially went to [facility] today because when he started drinking a red gatorade he startede experiencing hives and feeling flushed. The ED treated him with 600 mcg epi, 150 fentanyl, 125 solu medrol, 20 pepcid and 75 benadryl. After pt became stable the staff noticed that his rt leg was discolored and painful. Pulse only dopplerable. Pt was started on heparin at 1000 u/hr. pts leg now pink and pt states it feels normal.

## 2015-05-31 NOTE — Transfer of Care (Signed)
Immediate Anesthesia Transfer of Care Note  Patient: Sean Salas  Procedure(s) Performed: Procedure(s): Embolectomy Right Iliac Artery (Right)  Patient Location: PACU  Anesthesia Type:General  Level of Consciousness: sedated  Airway & Oxygen Therapy: Patient Spontanous Breathing and Patient connected to face mask oxygen  Post-op Assessment: Report given to RN and Post -op Vital signs reviewed and stable  Post vital signs: Reviewed and stable  Last Vitals:  Filed Vitals:   05/31/15 1915 05/31/15 1930  BP: 133/54 171/75  Pulse: 83 91  Temp:    Resp: 15 18    Complications: No apparent anesthesia complications

## 2015-05-31 NOTE — Progress Notes (Signed)
Per Dr. Kathrynn Humble wants patient over now. Patient creatinine at [facility] 1.1 05/31/15. RN from ED to bring patient over to CT.

## 2015-05-31 NOTE — ED Notes (Signed)
Patient transported to CT 

## 2015-05-31 NOTE — Consult Note (Signed)
Referred by:  [facility] ED  Reason for referral: R leg acute ischemia   History of Present Illness  Sean Salas is a 75 y.o. (22-Oct-1940) male who presents with chief complaint: SOB and rash.  This patient was seen at OSH earlier today after developing angioedema with facial swelling,SOB, and wheals on his body.  Only intake today was "red gatorade."  He was reportedly also hypotensive upon arrival.  After resuscitation with 600 mcg epi, 150 fentanyl, 125 solu medrol, 20 pepcid and 75 benadryl, the patient had acute onset of right leg pain.  This was severe pain with aching character.  This pain reportedly improved after starting Heparin.  The patient denies any rest pain.  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  His atherosclerotic risk factors include: HTN, active smoking   Past Medical History  Diagnosis Date  . Hypertension   . Asthma   . COPD (chronic obstructive pulmonary disease) ([place])   . Arthritis   . Cancer ([place])   . Colon cancer [facility])     Past Surgical History  Procedure Laterality Date  . Abdominal surgery      Social History   Social History  . Marital Status: Married    Spouse Name: N/A  . Number of Children: N/A  . Years of Education: N/A   Occupational History  . Not on file.   Social History Main Topics  . Smoking status: Current Every Day Smoker -- 1.00 packs/day    Types: Cigarettes  . Smokeless tobacco: Not on file  . Alcohol Use: No  . Drug Use: No  . Sexual Activity: Not on file   Other Topics Concern  . Not on file   Social History Narrative  . No narrative on file    Family History: patient is unable to detail the medical history of his parents   Current Facility-Administered Medications  Medication Dose Route Frequency Provider Last Rate Last Dose  . heparin ADULT infusion 100 units/mL (25000 units/250 mL)  1,300 Units/hr  Intravenous Continuous Ricka Burdock, RPH 13 mL/hr at 05/31/15 1757 1,300 Units/hr at 05/31/15 1757   No current outpatient prescriptions on file.     Allergies  Allergen Reactions  . Magnesium-Containing Compounds     Magnesium in soft drinks - hives     REVIEW OF SYSTEMS:  (Positives checked otherwise negative)  CARDIOVASCULAR:   [ ]  chest pain,  [ ]  chest pressure,  [ ]  palpitations,  [ ]  shortness of breath when laying flat,  [ ]  shortness of breath with exertion,   [ ]  pain in feet when walking,  [ ]  pain in feet when laying flat, [ ]  history of blood clot in veins (DVT),  [ ]  history of phlebitis,  [ ]  swelling in legs,  [ ]  varicose veins  PULMONARY:   [ ]  productive cough,  [ ]  asthma,  [ ]  wheezing [x]  SOB [x]  facial swelling  NEUROLOGIC:   [ ]  weakness in arms or legs,  [x]  numbness in arms or legs,  [ ]  difficulty speaking or slurred speech,  [ ]  temporary loss of vision in one eye,  [ ]  dizziness  HEMATOLOGIC:   [ ]  bleeding problems,  [ ]  problems with blood clotting too easily  MUSCULOSKEL:   [ ]  joint pain, [ ]  joint swelling  GASTROINTEST:   [ ]  vomiting blood,  [ ]   blood in stool     GENITOURINARY:   [ ]  burning with urination,  [ ]  blood in urine  PSYCHIATRIC:   [ ]  history of major depression  INTEGUMENTARY:   [x]  rashes,  [ ]  ulcers  CONSTITUTIONAL:   [ ]  fever,  [ ]  chills   For VQI Use Only   PRE-ADM LIVING: Home  AMB STATUS: Ambulatory  CAD Sx: None  PRIOR CHF: None  STRESS TEST: [x ] No, [ ]  Normal, [ ]  + ischemia, [ ]  + MI, [ ]  Both   Physical Examination  Filed Vitals:   05/31/15 1735 05/31/15 1745 05/31/15 1845 05/31/15 1915  BP: 149/83 149/70 139/63 133/54  Pulse: 101 97 88 83  Temp: 98.2 F (36.8 C)     TempSrc: Oral     Resp: 20 18 13 15   SpO2: 96% 96% 94% 95%   There is no height or weight on file to calculate BMI.  General: A&O x 3, WDWN  Head: [place]/AT  Ear/Nose/Throat: Hearing grossly  intact, nares w/o erythema or drainage, oropharynx w/o Erythema/Exudate, Mallampati score: 3  Eyes: PERRLA, EOMI  Neck: Supple, no nuchal rigidity, no palpable LAD  Pulmonary: Sym exp, good air movt, CTAB, no rales, rhonchi, & wheezing  Cardiac: RRR, Nl S1, S2, no Murmurs, rubs or gallops  Vascular: Vessel Right Left  Radial Palpable Palpable  Brachial Palpable Palpable  Carotid Palpable, without bruit Palpable, without bruit  Aorta Not palpable N/A  Femoral Not Palpable Palpable  Popliteal Not palpable Not palpable  PT Not Palpable (Dopplerable) Not Palpable  DP Not Palpable (Dopplerable) Not Palpable   Gastrointestinal: soft, NTND, no G/R, no HSM, no masses, no CVAT B, lower midline incision  Musculoskeletal: M/S 5/5 throughout with intact DF/PF, Extremities without ischemic changes except mild cyanosis in R toes, warm feet bilaterally  Neurologic: CN 2-12 intact , Pain and light touch intact in extremities except decreased in L foot Motor exam as listed above  Psychiatric: Judgment intact, Mood & affect appropriate for pt's clinical situation  Dermatologic: See M/S exam for extremity exam, no rashes otherwise noted  Lymph : No Cervical, Axillary, or Inguinal lymphadenopathy    Laboratory: CBC:    Component Value Date/Time   WBC 21.3* 05/31/2015 1755   RBC 4.11* 05/31/2015 1755   HGB 13.6 05/31/2015 1805   HCT 40.0 05/31/2015 1805   PLT 152 05/31/2015 1755   MCV 91.5 05/31/2015 1755   MCH 31.6 05/31/2015 1755   MCHC 34.6 05/31/2015 1755   RDW 13.1 05/31/2015 1755   LYMPHSABS 0.3* 05/31/2015 1755   MONOABS 0.8 05/31/2015 1755   EOSABS 0.0 05/31/2015 1755   BASOSABS 0.0 05/31/2015 1755    BMP:    Component Value Date/Time   NA 141 05/31/2015 1805   K 4.0 05/31/2015 1805   CL 105 05/31/2015 1805   CO2 30 03/27/2007 1233   GLUCOSE 214* 05/31/2015 1805   BUN 14 05/31/2015 1805   CREATININE 1.00 05/31/2015 1805   CALCIUM 9.5 03/27/2007 1233   GFRNONAA >60  03/27/2007 1233   GFRAA  03/27/2007 1233    >60        The eGFR has been calculated using the MDRD equation. This calculation has not been validated in all clinical    Coagulation: Lab Results  Component Value Date   INR 1.0 03/27/2007   No results found for: PTT  Lipids: No results found for: CHOL, TRIG, HDL, CHOLHDL, VLDL, LDLCALC, LDLDIRECT   Radiology: Ct  Angio Ao+bifem W/cm &/or Wo/cm  05/31/2015  CLINICAL DATA:  Right lower extremity pain with nonpalpable pulse. EXAM: CT ANGIOGRAPHY AORTOBIFEMORAL WITHOUT AND WITH CONTRAST CONTRAST:  143m OMNIPAQUE IOHEXOL 350 MG/ML SOLN COMPARISON:  None. FINDINGS: There is a 4 cm occlusion of the right external iliac artery. I suspect this is an acute thrombus based on the clinical history. There is diffuse atherosclerotic irregularity of the abdominal aorta. Superior and inferior mesenteric arteries are patent. Celiac artery is patent. Single patent renal arteries. Slight narrowing at the origin of the left renal artery. Common femoral arteries are widely patent. Diffuse atheromatous irregularity of the superficial femoral arteries bilaterally without occlusion. Popliteal arteries are widely patent. There is occlusion of the proximal anterior tibial arteries bilaterally. The peroneal arteries and posterior tibial arteries are patent bilaterally. The heart size and visible pulmonary vascularity are normal. Minimal atelectasis at the lung bases. No pleural effusions. There is a small amount of fluid around the distal esophagus and there is suggestion of mucosal thickening in a posterior to the left atrium. This could represent esophagitis. I cannot exclude a mass. Liver, spleen, pancreas, and adrenal glands are normal. Benign appearing cysts on the upper pole the right kidney, 32 mm and 23 mm. Bowel appears normal. Bladder is normal. Radioactive seeds in the prostate gland. No adenopathy. The osseous structures of the lower extremities are normal. Review  of the MIP images confirms the above findings. IMPRESSION: 4 cm occlusion of the right external iliac artery, probably acute. 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 Atherosclerosis of the superficial femoral arteries. 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 Electronically Signed   By: JLorriane ShireM.D.   On: 05/31/2015 19:24   Based on my review of the CTA, this patient has a R EIA embolus without evidence of distal emboli.   Medical Decision Making  Sean GLACEis a 75y.o. male who presents with: anaphylaxis due to unknown antigen exposure, likely acute embolism to right EIA.   Patient's hx is more consistent with acute embolism to the R EIA rather than in-situ thrombosis.  Its not clear to me how an anaphylactic reaction could cause an embolism unless he accumulated intracardiac thrombus while hypotensive during the acute phase of his anaphylaxis and then push the clot out with resumption of normal blood pressures..  In this situation, will redo steroids and proceed with: emergent right leg thrombectomy.   The risk, benefits, and alternative for bypass operations were discussed with the patient.  The patient is aware the risks include but are not limited to: bleeding, infection, myocardial infarction, stroke, limb loss, nerve damage, need for additional procedures in the future, wound complications, and inability to complete the thrombectomy if this is chronic thrombotic disease. The patient is aware of these risks and agreed to proceed. Given he has now dopplerable signals in his feet, I doubt fasciotomies will be needed.  Thank you for allowing [facility] participate in this patient's care.   BAdele Barthel MD [facility] GRooseveltOffice: [phone]: [phone] 05/31/2015, 7:38 PM

## 2015-05-31 NOTE — Progress Notes (Signed)
ANTICOAGULATION CONSULT NOTE - Initial Consult  Pharmacy Consult for heparin Indication: pulseless R leg  Allergies  Allergen Reactions  . Magnesium-Containing Compounds     Magnesium in soft drinks - hives    Patient Measurements:  Patient stated weight: 88 kg Height: 5'10" Heparin Dosing Weight: 78.5 kg  Vital Signs: Temp: 98.2 F (36.8 C) (01/01 1735) Temp Source: Oral (01/01 1735) BP: 149/83 mmHg (01/01 1735) Pulse Rate: 101 (01/01 1735)  Labs: No results for input(s): HGB, HCT, PLT, APTT, LABPROT, INR, HEPARINUNFRC, CREATININE, CKTOTAL, CKMB, TROPONINI in the last 72 hours.  CrCl cannot be calculated (Unknown ideal weight.).   Medical History: Past Medical History  Diagnosis Date  . Hypertension   . Asthma   . COPD (chronic obstructive pulmonary disease) ([place])   . Arthritis   . Cancer ([place])   . Colon cancer [facility])     Assessment: 75 yo M presents to ED from [facility] with pulseless R leg. Patient received 5000 unit bolus at 1543 and started on heparin drip at 1000 units/hr prior to arrival. Heparin drip discontinued when patient [facility]. Pharmacy consulted to restart heparin.  H/H stable, Plt wnl. No s/sx of bleeding noted.  Goal of Therapy:  Heparin level 0.3-0.7 units/ml Monitor platelets by anticoagulation protocol: Yes   Plan:  Start heparin infusion at 1300 units/hr Check anti-Xa level in 6 hours and daily while on heparin Continue to monitor H&H and platelets  Dimitri Ped, PharmD. PGY-1 Pharmacy Resident Pager: [phone]  05/31/2015,5:46 PM

## 2015-06-01 ENCOUNTER — Inpatient Hospital Stay (HOSPITAL_COMMUNITY): Payer: Medicare Other

## 2015-06-01 ENCOUNTER — Encounter (HOSPITAL_COMMUNITY): Payer: Self-pay | Admitting: Radiology

## 2015-06-01 DIAGNOSIS — I2699 Other pulmonary embolism without acute cor pulmonale: Secondary | ICD-10-CM

## 2015-06-01 DIAGNOSIS — I749 Embolism and thrombosis of unspecified artery: Secondary | ICD-10-CM

## 2015-06-01 DIAGNOSIS — I10 Essential (primary) hypertension: Secondary | ICD-10-CM

## 2015-06-01 LAB — CBC
HCT: 36 % — ABNORMAL LOW (ref 39.0–52.0)
HEMOGLOBIN: 11.8 g/dL — AB (ref 13.0–17.0)
MCH: 29.9 pg (ref 26.0–34.0)
MCHC: 32.8 g/dL (ref 30.0–36.0)
MCV: 91.1 fL (ref 78.0–100.0)
PLATELETS: 144 10*3/uL — AB (ref 150–400)
RBC: 3.95 MIL/uL — AB (ref 4.22–5.81)
RDW: 13.3 % (ref 11.5–15.5)
WBC: 19.8 10*3/uL — AB (ref 4.0–10.5)

## 2015-06-01 LAB — BASIC METABOLIC PANEL
ANION GAP: 10 (ref 5–15)
BUN: 13 mg/dL (ref 6–20)
CHLORIDE: 108 mmol/L (ref 101–111)
CO2: 21 mmol/L — ABNORMAL LOW (ref 22–32)
Calcium: 8.2 mg/dL — ABNORMAL LOW (ref 8.9–10.3)
Creatinine, Ser: 1.05 mg/dL (ref 0.61–1.24)
GFR calc Af Amer: >60 mL/min (ref >60)
Glucose, Bld: 176 mg/dL — ABNORMAL HIGH (ref 65–99)
POTASSIUM: 4.6 mmol/L (ref 3.5–5.1)
SODIUM: 139 mmol/L (ref 135–145)

## 2015-06-01 LAB — MRSA PCR SCREENING: MRSA by PCR: NEGATIVE

## 2015-06-01 LAB — HEPARIN LEVEL (UNFRACTIONATED): HEPARIN UNFRACTIONATED: 1.66 [IU]/mL — AB (ref 0.30–0.70)

## 2015-06-01 MED ORDER — ENOXAPARIN SODIUM 100 MG/ML ~~LOC~~ SOLN
1.0000 mg/kg | Freq: Two times a day (BID) | SUBCUTANEOUS | Status: DC
  Administered 2015-06-01 – 2015-06-02 (×3): 90 mg via SUBCUTANEOUS
  Filled 2015-06-01 (×4): qty 1

## 2015-06-01 MED ORDER — WARFARIN SODIUM 7.5 MG PO TABS
7.5000 mg | ORAL_TABLET | Freq: Once | ORAL | Status: AC
  Administered 2015-06-01: 7.5 mg via ORAL
  Filled 2015-06-01: qty 1

## 2015-06-01 MED ORDER — IOHEXOL 350 MG/ML SOLN
80.0000 mL | Freq: Once | INTRAVENOUS | Status: AC | PRN
  Administered 2015-06-01: 80 mL via INTRAVENOUS

## 2015-06-01 MED ORDER — HEPARIN (PORCINE) IN NACL 100-0.45 UNIT/ML-% IJ SOLN: 1100.0000 [IU]/h | INTRAMUSCULAR | Status: DC

## 2015-06-01 MED ORDER — CETYLPYRIDINIUM CHLORIDE 0.05 % MT LIQD
7.0000 mL | Freq: Two times a day (BID) | OROMUCOSAL | Status: DC
  Administered 2015-06-01 (×3): 7 mL via OROMUCOSAL

## 2015-06-01 MED ORDER — WARFARIN - PHARMACIST DOSING INPATIENT
Freq: Every day | Status: DC
  Administered 2015-06-01: 18:00:00

## 2015-06-01 NOTE — Progress Notes (Signed)
PT Cancellation Note  Patient Details Name: Sean Salas MRN: [id number] DOB: 27-Jul-1940   Cancelled Treatment:    Reason Eval/Treat Not Completed: Patient at procedure or test/unavailable.  1st attempt, pt is CT angio, 2nd attempt pt in another procedure/?echo.  Will try back as able. 06/01/2015  Donnella Sham, PT [phone]  (pager)   Florenda Watt, Tessie Fass 06/01/2015, 2:14 PM

## 2015-06-01 NOTE — Progress Notes (Addendum)
   Daily Progress Note  Assessment/Planning: POD #1 s/p R fem EA for R EIA embolism   No recurrence of angioedema and no airway sx  Biphasic signal in PT reflecting improvement in inflow in setting of known PAD B  Ok to transfer to floor  Thromboembolic work-up: CTA chest, CTA abd/pelvis (done), TTE  Lovenox bridge to Coumadin  Subjective  - 1 Day Post-Op  Right leg feels better  Objective Filed Vitals:   06/01/15 0600 06/01/15 0700 06/01/15 0800 06/01/15 0900  BP: 116/53 107/54 123/57 131/54  Pulse: 69 70 65 72  Temp:  98.4 F (36.9 C)    TempSrc:  Oral    Resp: 13 13 14 18   Weight:      SpO2: 92% 92% 93% 94%    Intake/Output Summary (Last 24 hours) at 06/01/15 0944 Last data filed at 06/01/15 0700  Gross per 24 hour  Intake   2034 ml  Output    725 ml  Net   1309 ml   HEAD  No facial swelling NECK  no edema PULM  CTAB, no stridor DERM no wheals CV  RRR GI  soft, NTND VASC  R: groin inc c/d/i, R PT biphasic, monophasic DP  Laboratory CBC    Component Value Date/Time   WBC 19.8* 06/01/2015 0309   HGB 11.8* 06/01/2015 0309   HCT 36.0* 06/01/2015 0309   PLT 144* 06/01/2015 0309    BMET    Component Value Date/Time   NA 139 06/01/2015 0309   K 4.6 06/01/2015 0309   CL 108 06/01/2015 0309   CO2 21* 06/01/2015 0309   GLUCOSE 176* 06/01/2015 0309   BUN 13 06/01/2015 0309   CREATININE 1.05 06/01/2015 0309   CALCIUM 8.2* 06/01/2015 0309   GFRNONAA >60 06/01/2015 0309   GFRAA >60 06/01/2015 0309    Adele Barthel, MD [facility] [employer]: [phone] Pager: [phone]  06/01/2015, 9:44 AM

## 2015-06-01 NOTE — Progress Notes (Signed)
  Echocardiogram 2D Echocardiogram has been performed.  Sean Salas 06/01/2015, 3:05 PM

## 2015-06-01 NOTE — Progress Notes (Signed)
Pt transferred to 2W38 with belongings. Report given to receiving RN and all questions answered. VSS during transfer.  Pt assisted to chair in new room. Receiving RN and NT in room to accept pt. Family updated on patient's location.

## 2015-06-01 NOTE — Progress Notes (Addendum)
ANTICOAGULATION CONSULT NOTE - Follow Up Consult  Pharmacy Consult for heparin Indication: pulseless R leg  Allergies  Allergen Reactions  . Magnesium-Containing Compounds     Magnesium in soft drinks - hives    Patient Measurements: Weight: 194 lb (87.998 kg)Patient stated weight: 88 kg Height: 5'10" Heparin Dosing Weight: 78.5 kg  Vital Signs: Temp: 98.4 F (36.9 C) (01/02 0700) Temp Source: Oral (01/02 0700) BP: 107/54 mmHg (01/02 0700) Pulse Rate: 70 (01/02 0700)  Labs:  Recent Labs  05/31/15 1755 05/31/15 1805 06/01/15 0309 06/01/15 0720  HGB 13.0 13.6 11.8*  --   HCT 37.6* 40.0 36.0*  --   PLT 152  --  144*  --   HEPARINUNFRC  --   --   --  1.66*  CREATININE  --  1.00 1.05  --     CrCl cannot be calculated (Unknown ideal weight.).  Assessment: 75 yo M presents to ED from [facility] with pulseless R leg. Patient received 5000 unit bolus at 1543 and started on heparin drip at 1000 units/hr prior to arrival. Now s/p right iliac thromboembolectomy. Patient received a heparin bolus of 8000 units intravenously per MD prior to the procedure. Heparin level this AM is elevated at 1.66 and RN verified that level was drawn appropriately. H/H + platelts are slightly low but on overt bleeding noted.   Goal of Therapy:  Heparin level 0.3-0.7 units/ml Monitor platelets by anticoagulation protocol: Yes   Plan:  - Hold heparin gtt x 30 minutes then resume at a lower dose of 1100 units/hr - Check an 8 hour heparin level - Daily heparin level and CBC  Salome Arnt, PharmD, BCPS Pager # [phone] 06/01/2015 8:50 AM  Addendum: Now transitioning to lovenox + warfarin.   Plan: - Lovenox 90mg  SQ Q12H - Warfarin 7.5mg  PO x 1 tonight - Daily INR  Salome Arnt, PharmD, BCPS Pager # [phone] 06/01/2015 9:35 AM

## 2015-06-01 NOTE — Progress Notes (Signed)
Utilization Review Completed.Donne Anon T1/07/2015

## 2015-06-02 ENCOUNTER — Encounter (HOSPITAL_COMMUNITY): Payer: Self-pay | Admitting: Vascular Surgery

## 2015-06-02 ENCOUNTER — Telehealth: Payer: Self-pay | Admitting: Vascular Surgery

## 2015-06-02 LAB — URINE CULTURE: Culture: NO GROWTH

## 2015-06-02 LAB — CBC
HEMATOCRIT: 30.3 % — AB (ref 39.0–52.0)
Hemoglobin: 9.9 g/dL — ABNORMAL LOW (ref 13.0–17.0)
MCH: 30.2 pg (ref 26.0–34.0)
MCHC: 32.7 g/dL (ref 30.0–36.0)
MCV: 92.4 fL (ref 78.0–100.0)
Platelets: 151 10*3/uL (ref 150–400)
RBC: 3.28 MIL/uL — AB (ref 4.22–5.81)
RDW: 13.8 % (ref 11.5–15.5)
WBC: 15.3 10*3/uL — AB (ref 4.0–10.5)

## 2015-06-02 LAB — PROTIME-INR
INR: 1.41 (ref 0.00–1.49)
Prothrombin Time: 17.3 seconds — ABNORMAL HIGH (ref 11.6–15.2)

## 2015-06-02 MED ORDER — ENOXAPARIN SODIUM 150 MG/ML ~~LOC~~ SOLN: 130.0000 mg | SUBCUTANEOUS | Status: AC

## 2015-06-02 MED ORDER — COUMADIN BOOK
Freq: Once | Status: AC
  Administered 2015-06-02: 14:00:00
  Filled 2015-06-02: qty 1

## 2015-06-02 MED ORDER — OXYCODONE-ACETAMINOPHEN 5-325 MG PO TABS: 1.0000 | ORAL_TABLET | ORAL | Status: AC | PRN

## 2015-06-02 MED ORDER — WARFARIN SODIUM 7.5 MG PO TABS: 7.5000 mg | ORAL_TABLET | Freq: Once | ORAL | Status: DC

## 2015-06-02 MED ORDER — WARFARIN SODIUM 7.5 MG PO TABS: 7.5000 mg | ORAL_TABLET | Freq: Every day | ORAL | Status: AC

## 2015-06-02 NOTE — Telephone Encounter (Signed)
-----   Message from Mena Goes, RN sent at 06/01/2015  7:01 PM EST ----- Regarding: schedule   ----- Message -----    From: Gabriel Earing, PA-C    Sent: 06/01/2015   8:50 AM      To: [facility] Charge Pool  S/p right iliac embolectomy 05/31/15.  F/u with Dr. Bridgett Larsson in 2 weeks.  Thanks, Aldona Bar

## 2015-06-02 NOTE — Progress Notes (Signed)
ANTICOAGULATION CONSULT NOTE - Follow Up Consult  Pharmacy Consult for lovenox + warfarin Indication: pulseless R leg  Allergies  Allergen Reactions  . Magnesium-Containing Compounds     Magnesium in soft drinks - hives    Patient Measurements: Weight: 194 lb (87.998 kg)Patient stated weight: 88 kg Height: 5'10" Heparin Dosing Weight: 78.5 kg  Vital Signs: Temp: 98.7 F (37.1 C) (01/03 0426) Temp Source: Oral (01/03 0426) BP: 115/51 mmHg (01/03 0426) Pulse Rate: 68 (01/03 0426)  Labs:  Recent Labs  05/31/15 1755 05/31/15 1805 06/01/15 0309 06/01/15 0720 06/02/15 0216  HGB 13.0 13.6 11.8*  --  9.9*  HCT 37.6* 40.0 36.0*  --  30.3*  PLT 152  --  144*  --  151  LABPROT  --   --   --   --  17.3*  INR  --   --   --   --  1.41  HEPARINUNFRC  --   --   --  1.66*  --   CREATININE  --  1.00 1.05  --   --     CrCl cannot be calculated (Unknown ideal weight.).  Assessment: 75 yo M presents to ED from [facility] with pulseless R leg. Patient received 5000 unit bolus and started on heparin drip at 1000 units/hr prior to arrival. Now s/p right iliac thromboembolectomy. Patient received a heparin bolus of 8000 units intravenously per MD prior to the procedure. Pt now transitioned to lovenox + warfarin. Today is D#2/5 of overlap. H/H down slightly but platelets are WNL. No bleeding noted.   Goal of Therapy:  INR 2-3 Anti-Xa level 0.6-1 units/ml 4hrs after LMWH dose given Monitor platelets by anticoagulation protocol: Yes   Plan: - Continue Lovenox 90mg  SQ Q12H - Repeat Warfarin 7.5mg  PO x 1 tonight - Daily INR  Salome Arnt, PharmD, BCPS Pager # [phone] 06/02/2015 7:40 AM

## 2015-06-02 NOTE — Telephone Encounter (Signed)
LM for pt re appt, dpm °

## 2015-06-02 NOTE — Discharge Instructions (Signed)

## 2015-06-02 NOTE — Care Management Note (Signed)
Case Management Note Marvetta Gibbons RN, BSN Unit 2W-Case Manager [phone]  Patient Details  Name: Sean Salas MRN: [id number] Date of Birth: 03/25/1941  Subjective/Objective:  Pt admitted s/p R iliac thrombectomy for R EIA thromboembolism; Severe anaphylactic reaction                  Action/Plan: PTA pt lived at home with spouse - plan to return home with spouse - no needs anticipated- per PT- no f/u recommended. Per conversation with pt he has RW and 3 pronged cane at home- referral for Lovenox bridge- insurance benefit check done-  Per rep at [employer]:   Lovenox: not covered-- generic however is covered  Enoxaparin: tier 4, $95.00 at retail pharmacy, no auth required  Patient can use: [facility], [facility], and [employer] [facility] with pt at bedside regarding copay coverage- pt would like CM to speak with wife- states she will be here around 2 pm, CM will return then to speak with wife. [facility] [place]- will call and check to see if drug is in stock- pt will be going home on 130 mg x7 days- will need 150 mg syringes- [facility]- they do have syringes in stock to fill script.   Expected Discharge Date:    06/02/15              Expected Discharge Plan:  Home/Self Care  In-House Referral:     Discharge planning Services  CM Consult, Medication Assistance  Post Acute Care Choice:  NA Choice offered to:  NA  DME Arranged:    DME Agency:     HH Arranged:    HH Agency:     Status of Service:  Completed, signed off  Medicare Important Message Given:    Date Medicare IM Given:    Medicare IM give by:    Date Additional Medicare IM Given:    Additional Medicare Important Message give by:     If discussed at [place] of Stay Meetings, dates discussed:    Discharge Disposition: Home/ Self Care   Additional Comments:  06/02/15- spoke with pt and wife regarding Lovenox copay cost and [facility]- answered questions and no further CM needs  noted. Pt to d/c home with wife.  Dawayne Patricia, RN 06/02/2015, 11:33 AM

## 2015-06-02 NOTE — Progress Notes (Signed)
Daily Progress Note  Assessment/Planning: POD #2 s/p R iliac thrombectomy for R EIA thromboembolism; Severe anaphylactic reaction   No evidence of recurrence of angioedema and anaphylaxis  ABI demonstrate sym biphasic flow in both legs  CTA chest,abd/pelvis suggest possible iliac disease as thrombotic etiology.  Echo fails to demonstrate any intracardiac thrombus.  Would continue anticoagulation for 3 months  D/C home once anticoagulation and PCP follow up arranged  Lovenox bridge to coumadin   Follow up in office in 2 weeks  Subjective  - 2 Days Post-Op  No complaints, walking ok, pain better  Objective Filed Vitals:   06/01/15 1100 06/01/15 1205 06/01/15 2001 06/02/15 0426  BP: 114/49 131/47 137/53 115/51  Pulse: 66 71 80 68  Temp:  98.4 F (36.9 C) 98.6 F (37 C) 98.7 F (37.1 C)  TempSrc:  Oral Oral Oral  Resp: 13  18 18   Weight:      SpO2: 93% 96% 97% 96%    Intake/Output Summary (Last 24 hours) at 06/02/15 0913 Last data filed at 06/01/15 2206  Gross per 24 hour  Intake    960 ml  Output      0 ml  Net    960 ml    PULM  CTAB CV  RRR GI  soft, NTND VASC  R groin incision c/d/i, faintly palpable bilateral DP  Laboratory CBC    Component Value Date/Time   WBC 15.3* 06/02/2015 0216   HGB 9.9* 06/02/2015 0216   HCT 30.3* 06/02/2015 0216   PLT 151 06/02/2015 0216    BMET    Component Value Date/Time   NA 139 06/01/2015 0309   K 4.6 06/01/2015 0309   CL 108 06/01/2015 0309   CO2 21* 06/01/2015 0309   GLUCOSE 176* 06/01/2015 0309   BUN 13 06/01/2015 0309   CREATININE 1.05 06/01/2015 0309   CALCIUM 8.2* 06/01/2015 0309   GFRNONAA >60 06/01/2015 0309   GFRAA >60 06/01/2015 0309   Lab Results  Component Value Date   INR 1.41 06/02/2015   INR 1.0 03/27/2007   Radiology: Dg Chest 2 View  05/31/2015  CLINICAL DATA:  presents with chief complaint: SOB and rash. This patient was seen at OSH earlier today after developing angioedema with  facial swelling,SOB. Only intake today was "red gatorade." EXAM: CHEST  2 VIEW COMPARISON:  Chest radiograph 05/31/2015 FINDINGS: Left anterior chest wall Port-A-Cath is present with tip projecting over the superior vena cava. Stable cardiac and mediastinal contours. No consolidative pulmonary opacities. No pleural effusion or pneumothorax. Thoracic spine degenerative changes. IMPRESSION: No acute cardiopulmonary process. Electronically Signed   By: Lovey Newcomer M.D.   On: 05/31/2015 20:01   Ct Angio Chest Pe W/cm &/or Wo Cm  06/01/2015  CLINICAL DATA:  Patient status post right iliac thromboembolectomy. EXAM: CT ANGIOGRAPHY CHEST WITH CONTRAST TECHNIQUE: Multidetector CT imaging of the chest was performed using the standard protocol during bolus administration of intravenous contrast. Multiplanar CT image reconstructions and MIPs were obtained to evaluate the vascular anatomy. CONTRAST:  70mL OMNIPAQUE IOHEXOL 350 MG/ML SOLN COMPARISON:  Chest CT 08/21/2012 FINDINGS: Mediastinum/Nodes: Left anterior chest wall Port-A-Cath is present with tip terminating in the superior vena cava. No enlarged axillary, mediastinal or hilar lymphadenopathy. Normal heart size. No pericardial effusion. Coronary arterial vascular calcifications. Aorta and main pulmonary artery are normal in caliber. There is a small hiatal hernia. Adequate opacification the main pulmonary artery. No evidence for pulmonary embolism. Lungs/Pleura: Central airways are patent. Dependent atelectasis within  the bilateral lower lobes. Extensive centrilobular and paraseptal emphysematous change most pronounced within the right upper lobe. Trace bilateral pleural effusions. No pneumothorax. Upper abdomen: Normal adrenal glands. Incompletely visualized simple cyst within the superior pole of the right kidney. Musculoskeletal: Thoracic spine degenerative changes. No aggressive or acute appearing osseous lesions. Review of the MIP images confirms the above  findings. IMPRESSION: No evidence for pulmonary embolism. Centrilobular and paraseptal emphysematous changes. Small bilateral pleural effusions and dependent atelectasis. Electronically Signed   By: Lovey Newcomer M.D.   On: 06/01/2015 13:39   Ct Angio Ao+bifem W/cm &/or Wo/cm  05/31/2015  CLINICAL DATA:  Right lower extremity pain with nonpalpable pulse. EXAM: CT ANGIOGRAPHY AORTOBIFEMORAL WITHOUT AND WITH CONTRAST CONTRAST:  162mL OMNIPAQUE IOHEXOL 350 MG/ML SOLN COMPARISON:  None. FINDINGS: There is a 4 cm occlusion of the right external iliac artery. I suspect this is an acute thrombus based on the clinical history. There is diffuse atherosclerotic irregularity of the abdominal aorta. Superior and inferior mesenteric arteries are patent. Celiac artery is patent. Single patent renal arteries. Slight narrowing at the origin of the left renal artery. Common femoral arteries are widely patent. Diffuse atheromatous irregularity of the superficial femoral arteries bilaterally without occlusion. Popliteal arteries are widely patent. There is occlusion of the proximal anterior tibial arteries bilaterally. The peroneal arteries and posterior tibial arteries are patent bilaterally. The heart size and visible pulmonary vascularity are normal. Minimal atelectasis at the lung bases. No pleural effusions. There is a small amount of fluid around the distal esophagus and there is suggestion of mucosal thickening in a posterior to the left atrium. This could represent esophagitis. I cannot exclude a mass. Liver, spleen, pancreas, and adrenal glands are normal. Benign appearing cysts on the upper pole the right kidney, 32 mm and 23 mm. Bowel appears normal. Bladder is normal. Radioactive seeds in the prostate gland. No adenopathy. The osseous structures of the lower extremities are normal. Review of the MIP images confirms the above findings. IMPRESSION: 4 cm occlusion of the right external iliac artery, probably acute. Extensive  atherosclerosis of the abdominal aorta iliac vessels. Atherosclerosis of the superficial femoral arteries. 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 Electronically Signed   By: Lorriane Shire M.D.   On: 05/31/2015 19:24   Echo (05/31/14): Study Conclusions  - Left ventricle: The cavity size was normal. There was mild concentric hypertrophy. Systolic function was normal. The estimated ejection fraction was in the range of 60% to 65%. Wall motion was normal; there were no regional wall motion abnormalities. Doppler parameters are consistent with abnormal left ventricular relaxation (grade 1 diastolic dysfunction). There was no evidence of elevated ventricular filling pressure by Doppler parameters. - Mitral valve: Structurally normal valve. - Left atrium: The atrium was mildly dilated. - Right ventricle: Systolic function was normal. - Tricuspid valve: There was no regurgitation. - Pulmonary arteries: Systolic pressure was within the normal range. - Inferior vena cava: The vessel was dilated. The respirophasic diameter changes were blunted (< 50%), consistent with elevated central venous pressure. - Pericardium, extracardiac: There was no pericardial effusion.   Adele Barthel, MD [facility] Office: [phone] Pager: [phone]  06/02/2015, 9:13 AM

## 2015-06-02 NOTE — Evaluation (Signed)
Physical Therapy Evaluation Patient Details Name: Sean Salas MRN: [id number] DOB: May 15, 1941 Today's Date: 06/02/2015   History of Present Illness  Patient is a 75 y/o male presents with pulseless RLE s/p R iliac thrombectomy for R EIA thromboembolism. PMH includes HTN, COPD, colon ca.   Clinical Impression  Patient able to ambulate community distances with only mild balance deficits that improved with increased distance most likely secondary to soreness and hx of neuropathy s/p above surgery. Reports mild soreness in RLE. Encouraged daily mobility while in hospital to maintain strength/mobility. Pt has support from wife at home. All education completed. Discussed resuming home exercise program. Seems to be functioning close to baseline and does not require further skilled therapy services. Discharge from therapy.    Follow Up Recommendations No PT follow up;Supervision - Intermittent    Equipment Recommendations  None recommended by PT    Recommendations for Other Services       Precautions / Restrictions Precautions Precautions: None Restrictions Weight Bearing Restrictions: No      Mobility  Bed Mobility               General bed mobility comments: sitting EOB upon PT arrival.   Transfers Overall transfer level: Modified independent Equipment used: None             General transfer comment: Stood from EOB x1, no instability noted.   Ambulation/Gait Ambulation/Gait assistance: Modified independent (Device/Increase time) Ambulation Distance (Feet): 250 Feet Assistive device: None Gait Pattern/deviations: Step-through pattern;Decreased stride length;Drifts right/left     General Gait Details: Drifting and mild instability noted initially however no overt LOB. Balance improved with increased distance. DOE 2/4. HR increased to 104 bpm.  Stairs            Wheelchair Mobility    Modified Rankin (Stroke Patients Only)       Balance Overall balance  assessment: Needs assistance Sitting-balance support: Feet supported;No upper extremity supported Sitting balance-Leahy Scale: Normal     Standing balance support: During functional activity Standing balance-Leahy Scale: Fair                               Pertinent Vitals/Pain Pain Assessment: Faces Faces Pain Scale: Hurts little more Pain Location: right groin Pain Descriptors / Indicators: Sore Pain Intervention(s): Monitored during session;Repositioned    Home Living Family/patient expects to be discharged to:: Private residence Living Arrangements: Spouse/significant other Available Help at Discharge: Family;Available PRN/intermittently (Wife works.) Type of Home: House Home Access: Stairs to enter   [employer] of Steps: 1- threshold Home Layout: Two level;Able to live on main level with bedroom/bathroom Home Equipment: Gilford Rile - 2 wheels;Cane - single point      Prior Function Level of Independence: Independent         Comments: Drives. 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     Hand Dominance        Extremity/Trunk Assessment   Upper Extremity Assessment: Defer to OT evaluation           Lower Extremity Assessment: Overall WFL for tasks assessed;RLE deficits/detail;LLE deficits/detail RLE Deficits / Details: tingling on dorsum of foot. LLE Deficits / Details: Stocking glove neuropathy LLE - premorbid     Communication   Communication: No difficulties  Cognition Arousal/Alertness: Awake/alert Behavior During Therapy: WFL for tasks assessed/performed Overall Cognitive Status: Within Functional Limits for tasks assessed  General Comments      Exercises        Assessment/Plan    PT Assessment Patent does not need any further PT services  PT Diagnosis Difficulty walking   PT Problem List    PT Treatment Interventions     PT Goals (Current goals can be found in the Care Plan  section) Acute Rehab PT Goals Patient Stated Goal: to build onto his cabin PT Goal Formulation: All assessment and education complete, DC therapy    Frequency     Barriers to discharge        Co-evaluation               End of Session Equipment Utilized During Treatment: Gait belt Activity Tolerance: Patient tolerated treatment well Patient left: in bed;with call bell/phone within reach (sitting EOB.) Nurse Communication: Mobility status         Time: [id number] PT Time Calculation (min) (ACUTE ONLY): 17 min   Charges:   PT Evaluation $PT Eval Low Complexity: 1 Procedure     PT G Codes:        Tavita Eastham A Braelen Sproule 06/02/2015, 11:00 AM  Wray Kearns, PT, DPT [phone]

## 2015-06-02 NOTE — Progress Notes (Signed)
OT Cancellation Note  Patient Details Name: DORSEY HAROLDSON MRN: [id number] DOB: 23-Apr-1941   Cancelled Treatment:    Reason Eval/Treat Not Completed: OT screened, no needs identified, will sign off. Spoke with pt and he has no OT concerns. Benito Mccreedy OTR/L [id number] 06/02/2015, 11:25 AM

## 2015-06-02 NOTE — Progress Notes (Signed)
Discharge instructions given to patient & wife.   Demonstrated to wife the procedure for the Lovenox injection.   Prescriptions given, voiced understanding of discharge instructions.  Telemetry discontinued, IV sites D/c  Mervyn Skeeters, RN

## 2015-06-04 DIAGNOSIS — I82409 Acute embolism and thrombosis of unspecified deep veins of unspecified lower extremity: Secondary | ICD-10-CM | POA: Diagnosis not present

## 2015-06-05 DIAGNOSIS — J449 Chronic obstructive pulmonary disease, unspecified: Secondary | ICD-10-CM | POA: Diagnosis not present

## 2015-06-05 DIAGNOSIS — L509 Urticaria, unspecified: Secondary | ICD-10-CM | POA: Diagnosis not present

## 2015-06-05 DIAGNOSIS — Z79899 Other long term (current) drug therapy: Secondary | ICD-10-CM | POA: Diagnosis not present

## 2015-06-05 DIAGNOSIS — Z7901 Long term (current) use of anticoagulants: Secondary | ICD-10-CM | POA: Diagnosis not present

## 2015-06-05 DIAGNOSIS — I10 Essential (primary) hypertension: Secondary | ICD-10-CM | POA: Diagnosis not present

## 2015-06-05 DIAGNOSIS — J45909 Unspecified asthma, uncomplicated: Secondary | ICD-10-CM | POA: Diagnosis not present

## 2015-06-10 NOTE — Discharge Summary (Signed)
[facility] Discharge Summary  Sean Salas 08-23-1940 75 y.o. male  [id number]  Admission Date: 05/31/2015  Discharge Date: 06/02/2015  Physician: Adele Barthel, MD  Admission Diagnosis: Lactic acidosis [E87.2] Critical lower limb ischemia [I99.8] Allergic reaction, initial encounter [T78.40XA] Nonpalpable pulse [Z78.9]  HPI:   This is a 75 y.o. male who presented with chief complaint: SOB and rash. This patient was seen at OSH earlier today after developing angioedema with facial swelling,SOB, and wheals on his body. Only intake today was "red gatorade." He was reportedly also hypotensive upon arrival. After resuscitation with 600 mcg epi, 150 fentanyl, 125 solu medrol, 20 pepcid and 75 benadryl, the patient had acute onset of right leg pain. This was severe pain with aching character. This pain reportedly improved after starting Heparin. The patient denies any rest pain. 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 His atherosclerotic risk factors include: HTN, active smoking.  Hospital Course:  The patient was admitted to the hospital and taken to the operating room on 05/31/2015 and underwent: right iliac thromboembolectomy.   Operative Findings:   1. Organized thrombus obtained from iliac artery 2. Posterior plaquing in common femoral artery  3. Palpable femoral pulse at end of case 4. Dopplerable right posterior tibial artery      The patient tolerated the procedure well and was transported to the PACU in good condition.   POD 1: The patient had no recurrence of angioedema and airway symptoms. He had a biphasic signal in the right posterior tibial artery. His thromboembolic workup was pending.   POD 2: His ABIs demonstrated symmetric biphasic flow in both legs. The CTA chest,abd/pelvis suggestedpossible iliac disease as thrombotic etiology.  Echo failed to demonstrate any intracardiac thrombus. Anticoagulation was recommended for 3 months. He was started on a lovenox to heparin bridge. Follow-up was arranged for INR check and follow-up of angioedema. The patient's pain had improved and he was ambulating without difficulty. He was discharged home on POD 2 in good condition.     CBC    Component Value Date/Time   WBC 15.3* 06/02/2015 0216   RBC 3.28* 06/02/2015 0216   HGB 9.9* 06/02/2015 0216   HCT 30.3* 06/02/2015 0216   PLT 151 06/02/2015 0216   MCV 92.4 06/02/2015 0216   MCH 30.2 06/02/2015 0216   MCHC 32.7 06/02/2015 0216   RDW 13.8 06/02/2015 0216   LYMPHSABS 0.3* 05/31/2015 1755   MONOABS 0.8 05/31/2015 1755   EOSABS 0.0 05/31/2015 1755   BASOSABS 0.0 05/31/2015 1755    BMET    Component Value Date/Time   NA 139 06/01/2015 0309   K 4.6 06/01/2015 0309   CL 108 06/01/2015 0309   CO2 21* 06/01/2015 0309   GLUCOSE 176* 06/01/2015 0309   BUN 13 06/01/2015 0309   CREATININE 1.05 06/01/2015 0309   CALCIUM 8.2* 06/01/2015 0309   GFRNONAA >60 06/01/2015 0309   GFRAA >60 06/01/2015 0309     Discharge Instructions:   The patient is discharged to home with extensive instructions on wound care and progressive ambulation.  They are instructed not to drive or perform any heavy lifting until returning to see the physician in his office.  Discharge Instructions    Call MD for:  redness, tenderness, or signs of infection (pain, swelling, bleeding, redness, odor or green/yellow discharge around incision site)    Complete by:  As directed  Call MD for:  severe or increased pain, loss or decreased feeling  in affected limb(s)    Complete by:  As directed      Call MD for:  temperature >100.5    Complete by:  As directed      Discharge wound care:    Complete by:  As directed   Wash wounds daily with soap and water and pat dry.     Driving Restrictions    Complete by:  As directed   No driving for 2 weeks      Increase activity slowly    Complete by:  As directed   Walk with assistance use walker or cane as needed     Lifting restrictions    Complete by:  As directed   No lifting for 2 weeks     Resume previous diet    Complete by:  As directed            Discharge Diagnosis:  Lactic acidosis [E87.2] Critical lower limb ischemia [I99.8] Allergic reaction, initial encounter [T78.40XA] Nonpalpable pulse [Z78.9]  Secondary Diagnosis: Patient Active Problem List   Diagnosis Date Noted  . Thromboembolism ([place]) 05/31/2015   Past Medical History  Diagnosis Date  . Hypertension   . Asthma   . COPD (chronic obstructive pulmonary disease) ([place])   . Arthritis   . Cancer ([place])   . Colon cancer ([place])        Medication List    TAKE these medications        enoxaparin 150 MG/ML injection  Commonly known as:  LOVENOX  Inject 0.87 mLs (130 mg total) into the skin daily.     losartan 25 MG tablet  Commonly known as:  COZAAR  Take 25 mg by mouth daily.     oxyCODONE-acetaminophen 5-325 MG tablet  Commonly known as:  PERCOCET/ROXICET  Take 1-2 tablets by mouth every 4 (four) hours as needed for moderate pain.     warfarin 7.5 MG tablet  Commonly known as:  COUMADIN  Take 1 tablet (7.5 mg total) by mouth daily at 6 PM.        Percocet #30 No Refill  Disposition: Home  Patient's condition: is Good  Follow up: 1. Dr. Bridgett Larsson in 2 weeks   Virgina Jock, PA-C [facility] [phone] 06/10/2015  3:05 PM   Addendum  I have independently interviewed and examined the patient, and I agree with the physician assistant's findings.  This patient presented as a transfer from an outside ED after an anaphylactic reaction.  His CTA demonstrated a R iliac artery occlusion.  He was brought back to the OR for a thromboembolectomy.  His embolic work-up was non-diagnostic though he had evidence of atherosclerotic disease throughout.  This patient was discharged on Lovenox  bridge to Coumadin.  He will follow up in the office in 2 weeks.  Will continue anticoagulation for 3 months.  Adele Barthel, MD [facility] [place] Office: [phone] Pager: [phone]  06/11/2015, 7:05 AM

## 2015-06-11 DIAGNOSIS — I743 Embolism and thrombosis of arteries of the lower extremities: Secondary | ICD-10-CM

## 2015-06-11 DIAGNOSIS — I82409 Acute embolism and thrombosis of unspecified deep veins of unspecified lower extremity: Secondary | ICD-10-CM | POA: Diagnosis not present

## 2015-06-11 DIAGNOSIS — Z720 Tobacco use: Secondary | ICD-10-CM

## 2015-06-11 DIAGNOSIS — C61 Malignant neoplasm of prostate: Secondary | ICD-10-CM | POA: Diagnosis not present

## 2015-06-11 DIAGNOSIS — G620 Drug-induced polyneuropathy: Secondary | ICD-10-CM | POA: Diagnosis not present

## 2015-06-11 DIAGNOSIS — D649 Anemia, unspecified: Secondary | ICD-10-CM | POA: Diagnosis not present

## 2015-06-11 DIAGNOSIS — Z8546 Personal history of malignant neoplasm of prostate: Secondary | ICD-10-CM | POA: Diagnosis not present

## 2015-06-11 DIAGNOSIS — Z85038 Personal history of other malignant neoplasm of large intestine: Secondary | ICD-10-CM | POA: Diagnosis not present

## 2015-06-11 DIAGNOSIS — G629 Polyneuropathy, unspecified: Secondary | ICD-10-CM | POA: Diagnosis not present

## 2015-06-12 ENCOUNTER — Encounter: Payer: Self-pay | Admitting: Vascular Surgery

## 2015-06-19 ENCOUNTER — Encounter: Payer: Self-pay | Admitting: Vascular Surgery

## 2015-06-19 ENCOUNTER — Ambulatory Visit (INDEPENDENT_AMBULATORY_CARE_PROVIDER_SITE_OTHER): Payer: Medicare Other | Admitting: Vascular Surgery

## 2015-06-19 VITALS — BP 133/76 | HR 77 | Temp 97.7°F | Resp 16 | Ht 70.0 in | Wt 189.0 lb

## 2015-06-19 DIAGNOSIS — I749 Embolism and thrombosis of unspecified artery: Secondary | ICD-10-CM

## 2015-06-19 NOTE — Progress Notes (Signed)
    Postoperative Visit   History of Present Illness  Sean Salas is a 75 y.o. year old male who presents for postoperative follow-up for: R iliac TE (Date: 05/31/15).  The patient notes great improvement in R lower extremity symptoms.  The patient is able to complete their activities of daily living.  The patient's current symptoms are: mild paraesthesia in R foot.  For VQI Use Only  PRE-ADM LIVING: Home  AMB STATUS: Ambulatory  Physical Examination  Filed Vitals:   06/19/15 0954  BP: 133/76  Pulse: 77  Temp: 97.7 F (36.5 C)  Resp: 16   RLE: R groin is healing, faintly palpable R DP, pink R foot  Medical Decision Making  Sean Salas is a 75 y.o. year old male who presents s/p R iliac TE for R EIA thromboembolism .  Path on the thromboembolism is c/w thrombosis, so likely in-situ thrombosis from R EIA stenosis.  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  I would continue with anticoagulation for 3 months to help his body lyse any residual thrombus from this event.  The patient's R groin incision is healing appropriately with resolution of pre-operative symptoms. I discussed in depth with the patient the nature of atherosclerosis, and emphasized the importance of maximal medical management including strict control of blood pressure, blood glucose, and lipid levels, obtaining regular exercise, and cessation of smoking.  The patient is aware that without maximal medical management the underlying atherosclerotic disease process will progress, limiting the benefit of any interventions. The patient's surveillance will included ABI and aortoiliac duplex studies which will be completed in: 3 months, at which time the patient will be re-evaluated.   I suspect further angiography and possible intervention will be needed in the future, but would take the next  3 months to focus on maximal medical management. I emphasized the importance of routine surveillance of the patient's bypass, as the vascular surgery literature emphasize the improved patency possible with assisted primary patency procedures versus secondary patency procedures. The patient agrees to participate in their maximal medical care and routine surveillance.  Thank you for allowing [place] to participate in this patient's care.  Adele Barthel, MD [facility] [place] Office: [phone] Pager: [phone]  06/19/2015, 10:28 AM

## 2015-06-22 NOTE — Addendum Note (Signed)
Addended by: Dorthula Rue L on: 06/22/2015 09:30 AM   Modules accepted: Orders

## 2015-07-01 DIAGNOSIS — 419620001 Death: Secondary | SNOMED CT | POA: Diagnosis not present

## 2015-07-01 DEATH — deceased

## 2015-09-04 ENCOUNTER — Encounter (HOSPITAL_COMMUNITY): Payer: Medicare Other

## 2015-09-04 ENCOUNTER — Ambulatory Visit: Payer: Medicare Other | Admitting: Vascular Surgery

## 2015-10-02 ENCOUNTER — Encounter (HOSPITAL_COMMUNITY): Payer: Medicare Other

## 2015-10-02 ENCOUNTER — Ambulatory Visit: Payer: Medicare Other | Admitting: Vascular Surgery

## 2017-05-04 IMAGING — CT CT ANGIO AOBIFEM WO/W CM
1 of 8 series · 3 of 16 positions shown, 4 images · IV contrast (OMNI 350)
Comparison: None.

CLINICAL DATA: Right lower extremity pain with nonpalpable pulse.

EXAM:
CT ANGIOGRAPHY AORTOBIFEMORAL WITHOUT AND WITH CONTRAST
CONTRAST:  100mL OMNIPAQUE IOHEXOL 350 MG/ML SOLN

[Series 4: cta runoff (id) · axial · 0.79mm/px · z∈[-1022,-332]mm · 3 of 461 slices shown, 4 images]
[im 116/461  soft-tissue]
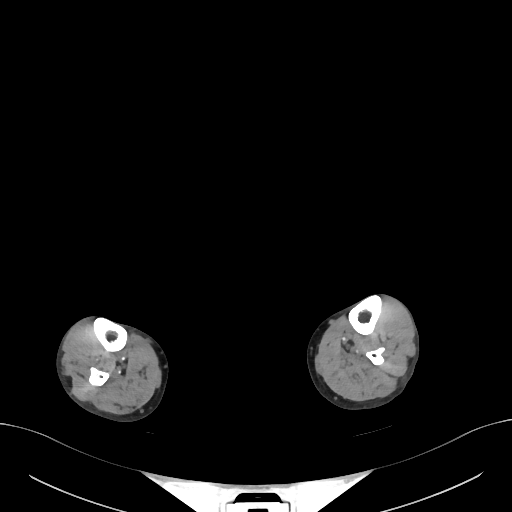
[im 116/461  bone]
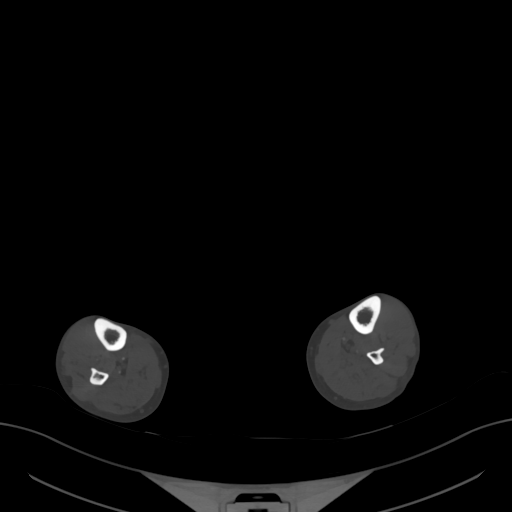
[im 231/461  soft-tissue]
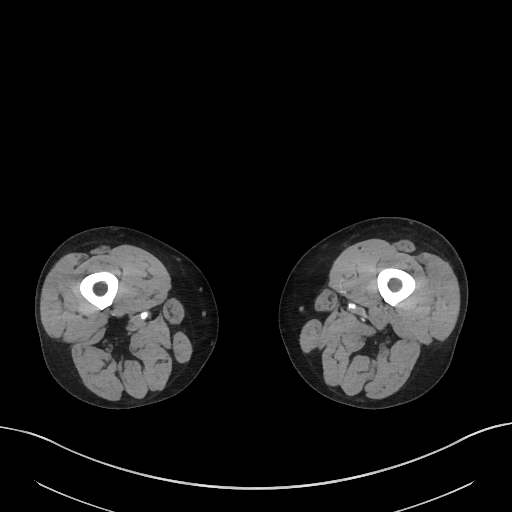
[im 346/461  soft-tissue]
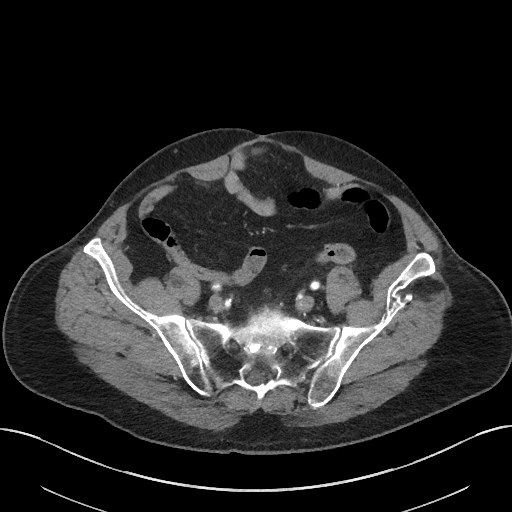

[3 of 16 positions shown; findings below may reference images not displayed]

FINDINGS: There is a 4 cm occlusion of the right external iliac artery. I
suspect this is an acute thrombus based on the clinical history.

There is diffuse atherosclerotic irregularity of the abdominal
aorta. Superior and inferior mesenteric arteries are patent. Celiac
artery is patent. Single patent renal arteries. Slight narrowing at
the origin of the left renal artery.

Common femoral arteries are widely patent. Diffuse atheromatous
irregularity of the superficial femoral arteries bilaterally without
occlusion. Popliteal arteries are widely patent.

There is occlusion of the proximal anterior tibial arteries
bilaterally. The peroneal arteries and posterior tibial arteries are
patent bilaterally.

The heart size and visible pulmonary vascularity are normal. Minimal
atelectasis at the lung bases. No pleural effusions.

There is a small amount of fluid around the distal esophagus and
there is suggestion of mucosal thickening in a posterior to the left
atrium. This could represent esophagitis. I cannot exclude a mass.

Liver, spleen, pancreas, and adrenal glands are normal. Benign
appearing cysts on the upper pole the right kidney, 32 mm and 23 mm.

Bowel appears normal. Bladder is normal. Radioactive seeds in the
prostate gland.

No adenopathy.

The osseous structures of the lower extremities are normal.

Review of the MIP images confirms the above findings.
IMPRESSION: 4 cm occlusion of the right external iliac artery, probably acute.

Extensive atherosclerosis of the abdominal aorta iliac vessels.
Atherosclerosis of the superficial femoral arteries.

Probable chronic occlusion of the anterior tibial arteries
bilaterally.

## 2017-05-04 IMAGING — CR DG CHEST 2V
2 series · 3 of 3 positions shown · non-contrast
Comparison: Chest radiograph 05/31/2015

CLINICAL DATA: presents with chief complaint: SOB and rash. This
patient was seen at [facility] earlier today after developing angioedema
with facial swelling,SOB. Only intake today was "red gatorade."

EXAM:
CHEST  2 VIEW

[chest lat]
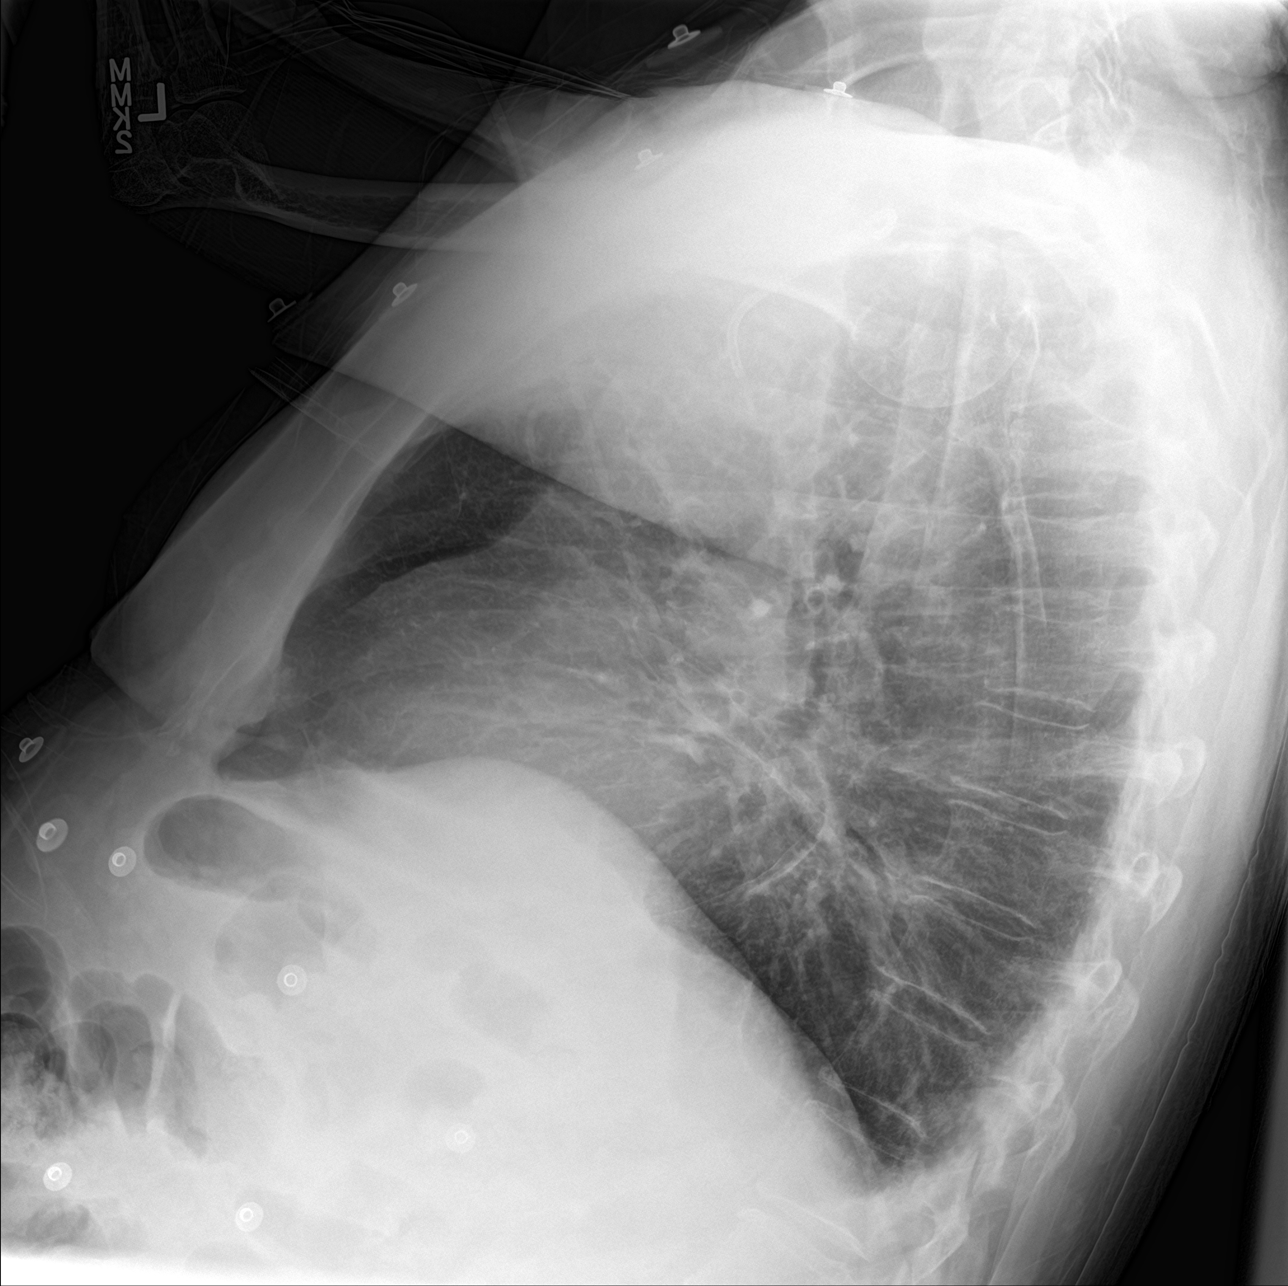

[Series 3: chest ap · 0.14mm/px · 2 of 2 slices shown]
[im 1/2]
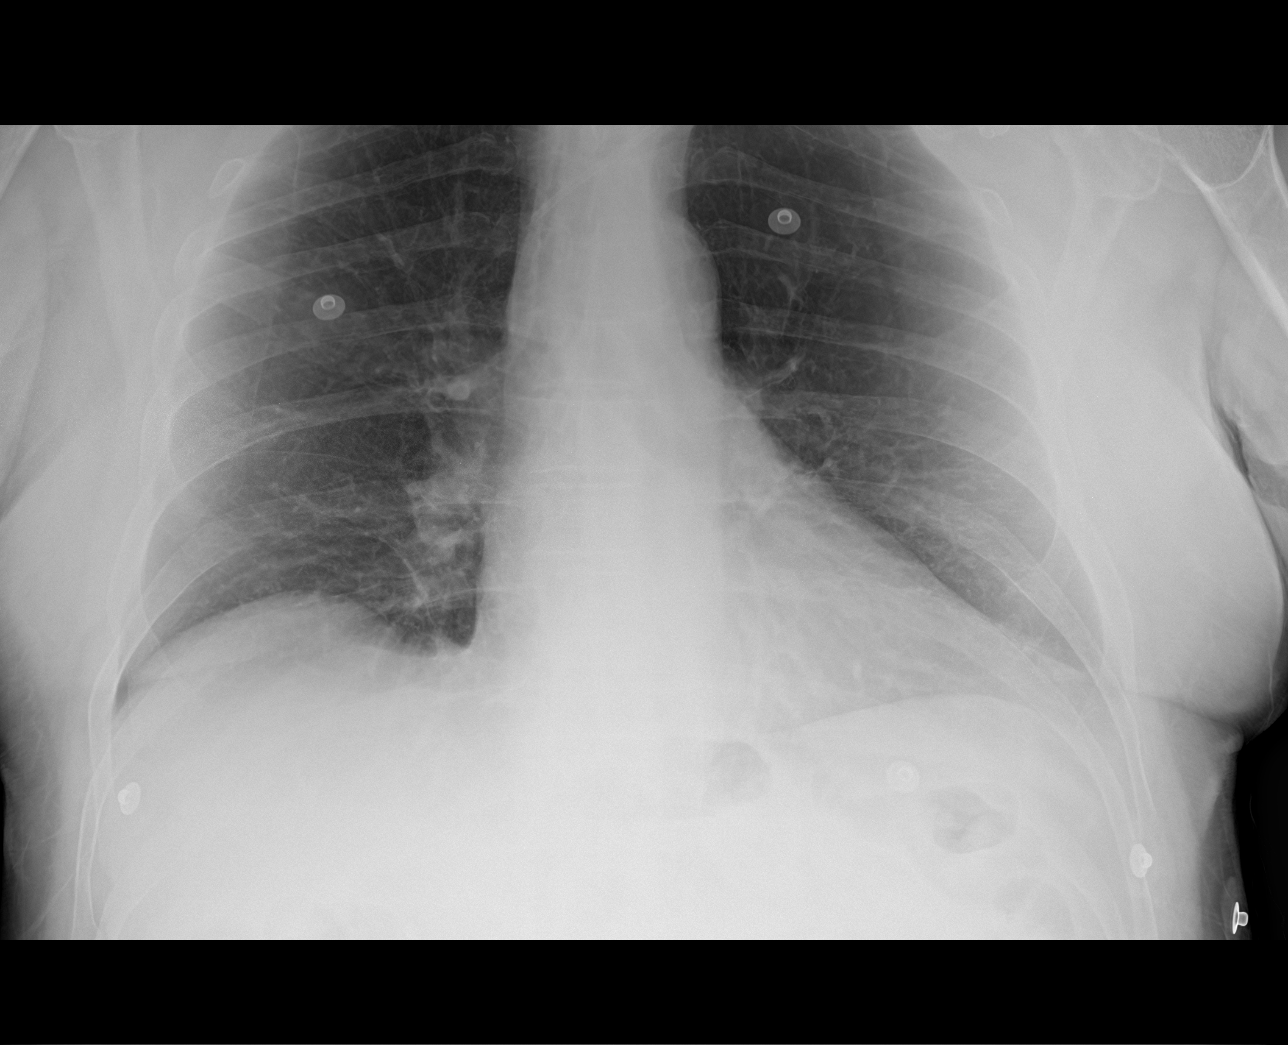
[im 2/2]
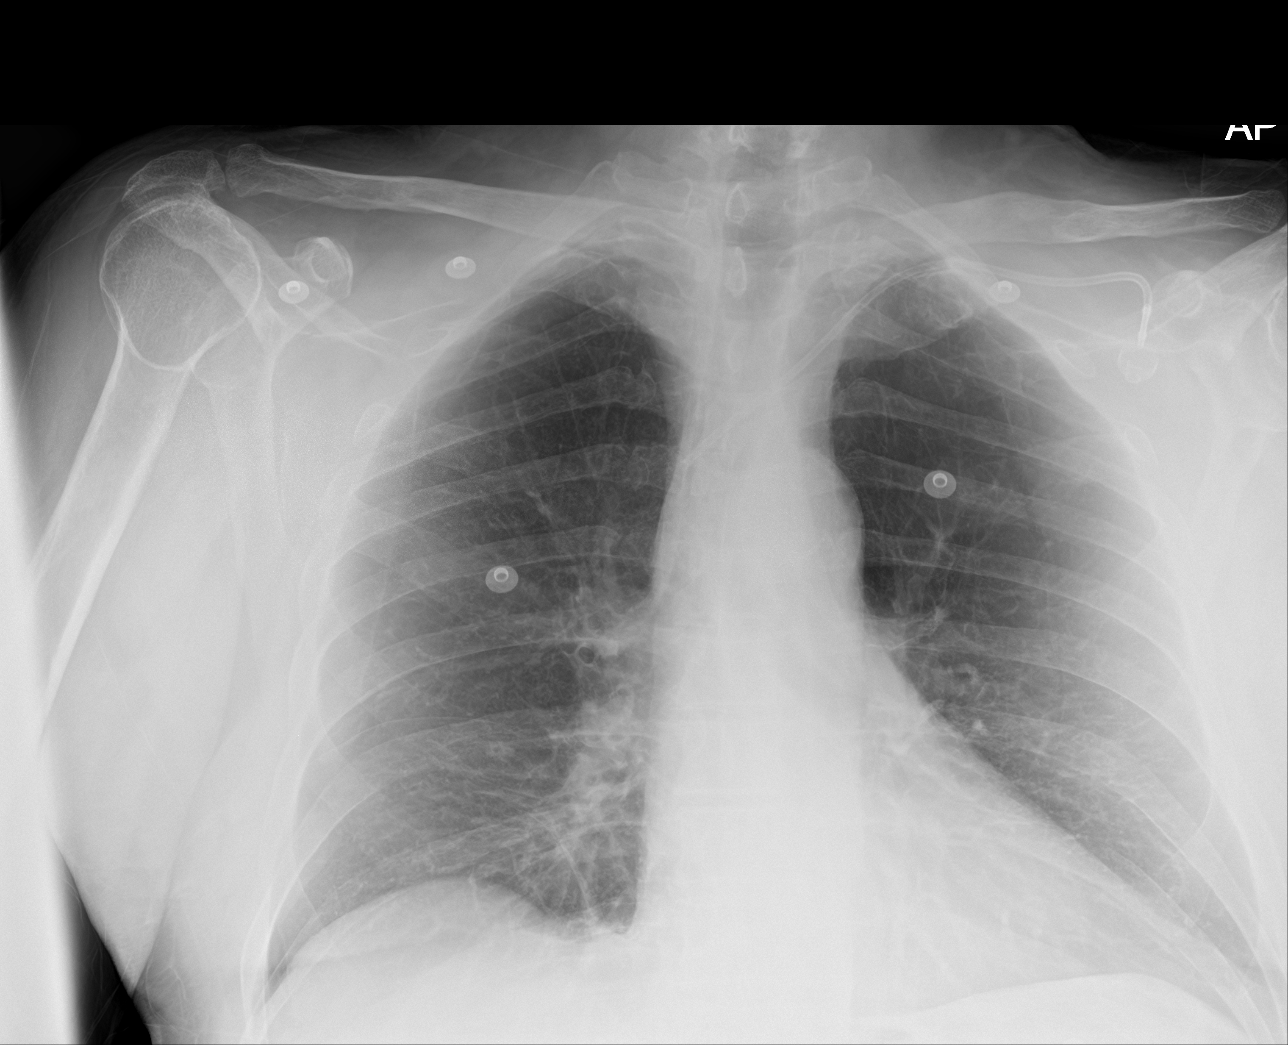

[3 of 3 positions shown; findings below may reference images not displayed]

FINDINGS: Left anterior chest wall Port-A-Cath is present with tip projecting
over the superior vena cava. Stable cardiac and mediastinal
contours. No consolidative pulmonary opacities. No pleural effusion
or pneumothorax. Thoracic spine degenerative changes.
IMPRESSION: No acute cardiopulmonary process.
